# Patient Record
Sex: Female | Born: 1995 | Race: Black or African American | Hispanic: No | Marital: Single | Smoking: Never smoker
Health system: Southern US, Community
[De-identification: ages and names within clinical notes are randomized; demographics above are authoritative.]

## PROBLEM LIST (undated history)

## (undated) ENCOUNTER — Emergency Department (HOSPITAL_COMMUNITY)

## (undated) DIAGNOSIS — B3731 Acute candidiasis of vulva and vagina: Secondary | ICD-10-CM

## (undated) DIAGNOSIS — N390 Urinary tract infection, site not specified: Secondary | ICD-10-CM

## (undated) DIAGNOSIS — F419 Anxiety disorder, unspecified: Secondary | ICD-10-CM

## (undated) DIAGNOSIS — B373 Candidiasis of vulva and vagina: Secondary | ICD-10-CM

## (undated) DIAGNOSIS — A749 Chlamydial infection, unspecified: Secondary | ICD-10-CM

## (undated) HISTORY — PX: NO PAST SURGERIES: SHX2092

## (undated) HISTORY — DX: Anxiety disorder, unspecified: F41.9

---
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
## 2015-05-08 ENCOUNTER — Encounter (HOSPITAL_COMMUNITY): Payer: Self-pay | Admitting: *Deleted

## 2015-05-08 ENCOUNTER — Emergency Department (HOSPITAL_COMMUNITY): Payer: Medicaid Other

## 2015-05-08 ENCOUNTER — Emergency Department (HOSPITAL_COMMUNITY)
Admission: EM | Admit: 2015-05-08 | Discharge: 2015-05-08 | Disposition: A | Discharge status: Home / Self Care | Payer: Medicaid Other | Attending: Emergency Medicine | Admitting: Emergency Medicine

## 2015-05-08 DIAGNOSIS — Z79899 Other long term (current) drug therapy: Secondary | ICD-10-CM | POA: Diagnosis not present

## 2015-05-08 DIAGNOSIS — Z3202 Encounter for pregnancy test, result negative: Secondary | ICD-10-CM | POA: Diagnosis not present

## 2015-05-08 DIAGNOSIS — R0789 Other chest pain: Secondary | ICD-10-CM | POA: Diagnosis present

## 2015-05-08 DIAGNOSIS — J029 Acute pharyngitis, unspecified: Secondary | ICD-10-CM | POA: Insufficient documentation

## 2015-05-08 LAB — POC URINE PREG, ED: Preg Test, Ur: NEGATIVE

## 2015-05-08 MED ORDER — NAPROXEN 500 MG PO TABS: 500.0000 mg | ORAL_TABLET | Freq: Two times a day (BID) | ORAL | Status: DC

## 2015-05-08 NOTE — ED Notes (Signed)
Returned from xray

## 2015-05-08 NOTE — ED Provider Notes (Signed)
CSN: [id number]     Arrival date & time 05/08/15  [id number] History   First MD Initiated Contact with Patient 05/08/15 1007     Chief Complaint  Patient presents with  . Chest Pain     (Consider location/radiation/quality/duration/timing/severity/associated sxs/prior Treatment) The history is provided by the patient.   Laura Mendoza is a 19 y.o. female presenting with an 8 month plus history of left sided chest pain.  She describes constant soreness in the left upper chest wall into her lateral left breast which is worsened with palpation, lying on her stomach and with raising her left arm over her head. She denies injury, also denies shortness of breath, cough, weakness, fevers or chills. Her left breast is slightly larger than the right as well.  She denies nodules, mass, nipple discharge and is not pregnant.  Is sexually active, not currently on birth control, but took a home pregnancy test 2 weeks ago which was negative.  She was seen here last year for similar symptoms, has not followed up, no pcp.  She has had no treatments, has found no alleviators.    History reviewed. No pertinent past medical history. History reviewed. No pertinent past surgical history. History reviewed. No pertinent family history. Social History  Substance Use Topics  . Smoking status: Never Smoker   . Smokeless tobacco: None  . Alcohol Use: No   OB History    No data available     Review of Systems  Constitutional: Negative for fever and chills.  HENT: Positive for sore throat.   Eyes: Negative.   Respiratory: Negative for chest tightness and shortness of breath.   Cardiovascular: Positive for chest pain. Negative for palpitations and leg swelling.  Gastrointestinal: Negative for nausea, vomiting and abdominal pain.  Genitourinary: Negative.   Musculoskeletal: Negative for back pain and neck pain.  Skin: Negative.  Negative for rash and wound.  Neurological: Negative for dizziness,  weakness, light-headedness, numbness and headaches.  Psychiatric/Behavioral: Negative.       Allergies  Review of patient's allergies indicates no known allergies.  Home Medications   Prior to Admission medications   Medication Sig Start Date End Date Taking? Authorizing Provider  Multiple Vitamins-Minerals (MULTIVITAMIN GUMMIES WOMENS) CHEW Chew 2 tablets by mouth daily.   Yes Historical Provider, MD  ALPRAZolam (XANAX) 0.25 MG tablet Take 1 tablet (0.25 mg total) by mouth 2 (two) times daily as needed for anxiety. Patient not taking: Reported on 05/08/2015 04/21/14   Elson AreasLeslie K Sofia, PA-C  naproxen (NAPROSYN) 500 MG tablet Take 1 tablet (500 mg total) by mouth 2 (two) times daily. 05/08/15   Burgess AmorJulie Gorgeous [place], PA-C   BP 127/67 mmHg  Pulse 71  Temp(Src) 99.1 F (37.3 C) (Oral)  Resp 16  Ht 5\' 4"  (1.626 m)  Wt 100 lb (45.36 kg)  BMI 17.16 kg/m2  SpO2 99%  LMP 10/06/2014 Physical Exam  Constitutional: She appears well-developed and well-nourished.  HENT:  Head: Atraumatic.  Neck: Normal range of motion. Neck supple.  nontender  Cardiovascular: Regular rhythm.   Pulses:      Radial pulses are 2+ on the right side, and 2+ on the left side.  PulseBreast nontender, no masses, no dimpling. s equal bilaterally.    Pulmonary/Chest: Breath sounds normal.  Abdominal: Soft. Bowel sounds are normal. She exhibits no distension. There is no tenderness.  Musculoskeletal: She exhibits tenderness.  ttp left upper chest wall and into left axilla along edge of pectoralis.   Pain is increased  with resisted bicep flexion.  Neurological: She is alert. She has normal strength. She displays normal reflexes. No sensory deficit.  Skin: Skin is warm and dry.  Psychiatric: She has a normal mood and affect.    ED Course  Procedures (including critical care time) Labs Review Labs Reviewed  POC URINE PREG, ED    Imaging Review Dg Chest 2 View  05/08/2015  CLINICAL DATA:  Left upper chest pain for 8  months extending down the left arm. Occasional right arm pain. EXAM: CHEST  2 VIEW COMPARISON:  04/21/2014 FINDINGS: The heart size and mediastinal contours are within normal limits. Both lungs are clear. The visualized skeletal structures are unremarkable. IMPRESSION: No active cardiopulmonary disease. Electronically Signed   By: Gaylyn Rong M.D.   On: 05/08/2015 11:56   I have personally reviewed and evaluated these images and lab results as part of my medical decision-making.   EKG Interpretation None      MDM   Final diagnoses:  Chest wall pain    Patients  labs reviewed.  Radiological studies were viewed, interpreted and considered during the medical decision making and disposition process. I agree with radiologists reading.  Results were also discussed with patient.   Patient has reproducible chest wall pain suggesting musculoskeletal source.  She has no shortness of breath.  Breast exam is normal.  Advised heat therapy, anti-inflammatories.  She was given referrals to primary care for establishment of care and follow-up if symptoms persist or worsen.  The patient appears reasonably screened and/or stabilized for discharge and I doubt any other medical condition or other [facility] requiring further screening, evaluation, or treatment in the ED at this time prior to discharge.      Burgess Amor, PA-C 05/08/15 1215  Benjiman Core, MD 05/11/15 1434

## 2015-05-08 NOTE — ED Notes (Signed)
Patient transported to X-ray 

## 2015-05-08 NOTE — Discharge Instructions (Signed)
Chest Wall Pain Chest wall pain is pain in or around the bones and muscles of your chest. Sometimes, an injury causes this pain. Sometimes, the cause may not be known. This pain may take several weeks or longer to get better. HOME CARE INSTRUCTIONS  Pay attention to any changes in your symptoms. Take these actions to help with your pain:   Rest as told by your health care provider.   Avoid activities that cause pain. These include any activities that use your chest muscles or your abdominal and side muscles to lift heavy items.   If directed, apply ice to the painful area:  Put ice in a plastic bag.  Place a towel between your skin and the bag.  Leave the ice on for 20 minutes, 2-3 times per day.  Take over-the-counter and prescription medicines only as told by your health care provider.  Do not use tobacco products, including cigarettes, chewing tobacco, and e-cigarettes. If you need help quitting, ask your health care provider.  Keep all follow-up visits as told by your health care provider. This is important. SEEK MEDICAL CARE IF:  You have a fever.  Your chest pain becomes worse.  You have new symptoms. SEEK IMMEDIATE MEDICAL CARE IF:  You have nausea or vomiting.  You feel sweaty or light-headed.  You have a cough with phlegm (sputum) or you cough up blood.  You develop shortness of breath.   This information is not intended to replace advice given to you by your health care provider. Make sure you discuss any questions you have with your health care provider.   Document Released: 07/13/2005 Document Revised: 04/03/2015 Document Reviewed: 10/08/2014 Elsevier Interactive Patient Education 2016 [employer]    Use the medication prescribed.  In addition I recommend applying a heating pad twice daily tear chest wall, 20 minutes each treatment.  Your chest x-ray is negative today.  See the resource guide below for assistance locating a primary doctor.  I suspect  your symptoms are chest wall pain which means muscle soreness.  Please get rechecked for any persistent or worsening symptoms.     Emergency Department Resource Guide 1) Find a Doctor and Pay Out of Pocket Although you won't have to find out who is covered by your insurance plan, it is a good idea to ask around and get recommendations. You will then need to call the office and see if the doctor you have chosen will accept you as a new patient and what types of options they offer for patients who are self-pay. Some doctors offer discounts or will set up payment plans for their patients who do not have insurance, but you will need to ask so you aren't surprised when you get to your appointment.  2) Contact Your Local Health Department Not all health departments have doctors that can see patients for sick visits, but many do, so it is worth a call to see if yours does. If you don't know where your local health department is, you can check in your phone book. The CDC also has a tool to help you locate your state's health department, and many state websites also have listings of all of their local health departments.  3) Find a Walk-in Clinic If your illness is not likely to be very severe or complicated, you may want to try a walk in clinic. These are popping up all over the country in pharmacies, drugstores, and shopping centers. They're usually staffed by nurse practitioners or physician  assistants that have been trained to treat common illnesses and complaints. They're usually fairly quick and inexpensive. However, if you have serious medical issues or chronic medical problems, these are probably not your best option.  No Primary Care Doctor: - Call Health Connect at  [phone] - they can help you locate a primary care doctor that  accepts your insurance, provides certain services, etc. - Physician Referral Service- [phone]  Chronic Pain Problems: Organization         Address  Phone    Notes  [facility]  [phone] Patients need to be referred by their primary care doctor.   Medication Assistance: Organization         Address  Phone   Notes  [facility] Med[facility] [address] [place], [place] [address] [phone] --Must be a resident of [facility] -- Must have NO insurance coverage whatsoever (no Medicaid/ Medicare, etc.) -- The pt. MUST have a primary care doctor that directs their care regularly and follows them in the community   MedAssist  [phone]   [employer]  [phone]    Agencies that provide inexpensive medical care: Organization         Address  Phone   Notes  [place]  [phone]   [facility]    [phone]   [facility] [place], [place] [address] [phone]   [place] [address] [place] [phone]   Planned Parenthood    [phone]   [facility]    [phone]   [place] [address], [place] Phone:  [phone], Fax:  [phone] Hours of Operation:  9 am - 6 pm, M-F.  Also accepts Medicaid/Medicare and self-pay.  [facility] for [address], [address], [place] Phone: [phone], Fax: [phone]. Hours of Operation:  8:30 am - 5:30 pm, M-F.  Also accepts Medicaid and self-pay.  [facility] [place] [address] [place] Phone: [phone]   [employer], [place], [place] [phone], Ext. [phone] Mondays & Thursdays: 7-9 AM.  First 15 patients are seen on a first come, first serve basis.    [place] Providers:  Organization         Address  Phone   Notes  [facility] [address],  [phone] Also accepts self-pay patients.  [facility]  [id number] [address], [place]  [phone]   [place], [address], [place] [phone]   [facility] [address], [place] [phone]   Lucianne Lei [address], [place]   [phone] Only accepts [place] patients after they have their name applied to their card.   Self-Pay (no insurance) in [facility]:  Organization         Address  Phone   Notes  Sickle Cell Patients, [facility] [place] [phone]   [facility] [place] [phone]   [facility] [place]  [place], [address], [place] [phone]   [facility]/Dr. Vista Lawman  [address] [place]  [address], [place] or [address], [place] [phone] Phone number for both [place] and [place] locations is the same.  [facility]gent [facility] [address] [place] [phone]   [facility] [address], [place] or [address] [phone]   [facility] [address] [place] [phone], phone; [phone], fax Sees patients 1st and 3rd Saturday of every month.  Must not qualify for public or private insurance (i.e. Medicaid, Medicare, [place] Health Choice, Veterans' Benefits)  Household income should be no more than 200% of the poverty level The clinic cannot treat you if you are pregnant or think you are pregnant  Sexually transmitted diseases are not treated at the clinic.    Dental Care: Organization         Address  Phone  Notes  [facility] Department of [facility] [address] [place], [place] [phone] Accepts children up to age 26 who are enrolled in [place] or [place] Health Choice; pregnant women with a Medicaid card; and children who have  applied for Medicaid or [place] Health Choice, but were declined, whose parents can pay a reduced fee at time of service.  [facility] Department of [facility]  [address], [place] [phone] Accepts children up to age 55 who are enrolled in [place] or [place] Health Choice; pregnant women with a Medicaid card; and children who have applied for Medicaid or [place] Health Choice, but were declined, whose parents can pay a reduced fee at time of service.  [facility]  [address] [place], [place] [phone] Patients are seen by appointment only. Walk-ins are not accepted. [facility] will see patients 22 years of age and older. Monday - Tuesday (8am-5pm) Most Wednesdays (8:30-5pm) $30 per visit, cash only  [facility] Adult [facility] Access PRO[facility]  [address], [facility] [phone] Patients are seen by appointment only. Walk-ins are not accepted. [facility] will see patients 61 years of age and older. One Wednesday Evening (Monthly: Volunteer Based).  $30 per visit, cash only  [employer]  [phone] for adults; Children under age 3, call Graduate Pediatric Dentistry at [phone]. Children aged 68-14, please call [phone] to request a pediatric application.  Dental services are provided in all areas of dental care including fillings, crowns and bridges, complete and partial dentures, implants, gum treatment, root canals, and extractions. Preventive care is also provided. Treatment is provided to both adults and children. Patients are selected via a lottery and there is often a waiting list.   [facility] [address] [place]  [phone] [email or web address]   [facility] [address] [place], [place] [phone], Ext. [phone] Second and Fourth Thursday of each month, opens at 6:30 AM; Clinic ends at 9 AM.  Patients are seen on a first-come first-served basis, and a  limited number are seen during each clinic.   [facility]  [address] Ether [place], [place] [phone]   Eligibility Requirements You must have lived in [place] [employer], [place], or [place] counties for at least the last three months.   You cannot be eligible for state or federal sponsored [place], including [employer], [place], or [employer].   You generally cannot be eligible for healthcare insurance through your employer.    How to  apply: Eligibility screenings are held every Tuesday and Wednesday afternoon from 1:00 pm until 4:00 pm. You do not need an appointment for the interview!  [facility] [address] [place], [place] [phone]   [facility] Health Department  [phone]   [facility] Health Department  [phone]   [facility] Health Department  [phone]    Behavioral Health Resources in the Community: Intensive Outpatient Programs Organization         Address  Phone  Notes  [facility] [address] [place], [place] [phone]   [facility] [address], [place], [place] [phone]   [facility] [address] [place], [place]  [phone]   [facility] [address],  [place], [place] [phone] or [phone]   Substance Abuse Resources Organization         Address  Phone  Notes  [facility] Drug Services  [phone]   [facility]  [phone]   [facility] [place]  [phone]   Floydene Flock  [phone]   Residential & Outpatient Substance Abuse Program  [phone]   Psychological Services Organization         Address  Phone  Notes  [facility]  [phone]   [facility] Services  [phone]   [facility] [address], [place] [phone] or [phone]    Mobile Crisis Teams Organization          Address  Phone  Notes  [facility], Mobile Crisis Care Unit  [phone]   [facility]  [address] [place], [place] [phone]   Doristine Locks [address] [place] [phone]    Self-Help/Support Groups Organization         Address  Phone             Notes  [facility] [place] - variety of support groups  336- I7437963 Call for more information  Narcotics Anonymous (NA), [facility] [address], [employer] [place]  2 meetings at this location   Statistician         Address  Phone  Notes  [facility] [address],    [place]  [phone]   [facility]  [address] [place] [address], [place], [place] [phone]   [facility] [address] [place], [place] [phone] Admissions: 8am-3pm M-F  [facility] [address]    [place], [place] [phone]   [facility] [address] [place], [place], [place] [phone]   [facility] [address],  [place], [place] [phone]   [facility] - Intensive Outpatient [address], [place], [place] [phone]   [facility] [address] [place]  [place], [place] [phone] or [phone]   [facility] ([facility]) [address] [place], [place] [phone] Accepts Medicaid  [facility] [address],  [place] [phone] Substance Abuse/Addiction Treatment   [facility] Organization         Address  Phone  Notes  [facility]  [phone]   Angie Fava, PhD [address]vin Knack [place], [place]   [phone] or [phone]   [facility] Behavioral   [address] [place], [place] [phone]   [facility] [address] [place], [place] (  336) F9272065 Insurance/Medicaid/sponsorship  through [facility] and Families [address]                                    [place], [place] [phone] Therapy/tele-psych/case  [facility] [address]   [place], [place] [phone]    Dr. Lolly Mustache  [phone]   [facility] [place]  [employer] [facility] Dept. 1) [address], [place] 2) [address] [place] 3)  [address], [place] [phone]  [phone]   [facility] Child Abuse Hotline 787-377-1682 or [phone] (After Hours)

## 2015-05-08 NOTE — ED Notes (Signed)
C/o pain in left chest with radiation into left arm. Onset 8 months ago . C/o pai in her left breast states she feels her left breast looks different than right.,

## 2015-12-11 ENCOUNTER — Encounter (HOSPITAL_COMMUNITY): Payer: Self-pay

## 2015-12-11 ENCOUNTER — Emergency Department (HOSPITAL_COMMUNITY)
Admission: EM | Admit: 2015-12-11 | Discharge: 2015-12-11 | Disposition: A | Discharge status: Home / Self Care | Payer: Medicaid Other | Attending: Emergency Medicine | Admitting: Emergency Medicine

## 2015-12-11 DIAGNOSIS — Z791 Long term (current) use of non-steroidal anti-inflammatories (NSAID): Secondary | ICD-10-CM | POA: Insufficient documentation

## 2015-12-11 DIAGNOSIS — Z79899 Other long term (current) drug therapy: Secondary | ICD-10-CM | POA: Insufficient documentation

## 2015-12-11 DIAGNOSIS — M79604 Pain in right leg: Secondary | ICD-10-CM | POA: Insufficient documentation

## 2015-12-11 MED ORDER — NAPROXEN 500 MG PO TABS: 500.0000 mg | ORAL_TABLET | Freq: Two times a day (BID) | ORAL | Status: DC

## 2015-12-11 MED ORDER — DIAZEPAM 5 MG PO TABS: 5.0000 mg | ORAL_TABLET | Freq: Two times a day (BID) | ORAL | Status: DC

## 2015-12-11 NOTE — ED Notes (Signed)
Pt stable, ambulatory, states understanding of discharge instructions 

## 2015-12-11 NOTE — ED Provider Notes (Signed)
CSN: [id number]     Arrival date & time 12/11/15  1850 History  By signing my name below, I, Evon Slack, attest that this documentation has been prepared under the direction and in the presence of Melton Krebs, PA-C. Electronically Signed: Evon Slack, ED Scribe. 12/11/2015. 7:02 PM.    Chief Complaint  Patient presents with  . Leg Pain   Patient is a 20 y.o. female presenting with leg pain. The history is provided by the patient. No language interpreter was used.  Leg Pain  HPI Comments: Laura Mendoza is a 20 y.o. female who presents to the Emergency Department complaining of intermittent stinging right leg pain onset 6 months prior. Pt reports that the pain has recently worsened over the last 2 weeks. Pt states that the pain intermittently radiates from her toes to her upper leg. Pt also reports pain intermittently goes into the left leg as well. She states that the pain is worse when sitting or lying down. She states that the pain is intermittently better when standing. Pt states she has tried Advil with no relief.  Denies leg swelling or color change to the leg. Pt denies recent surgeries. Pt denies injury or trauma to the leg.    No past medical history on file. No past surgical history on file. No family history on file. Social History  Substance Use Topics  . Smoking status: Never Smoker   . Smokeless tobacco: Not on file  . Alcohol Use: No   OB History    No data available     Review of Systems  Musculoskeletal: Positive for arthralgias.   A complete 10 system review of systems was obtained and all systems are negative except as noted in the HPI and PMH.     Allergies  Review of patient's allergies indicates no known allergies.  Home Medications   Prior to Admission medications   Medication Sig Start Date End Date Taking? Authorizing Provider  ALPRAZolam (XANAX) 0.25 MG tablet Take 1 tablet (0.25 mg total) by mouth 2 (two) times daily  as needed for anxiety. Patient not taking: Reported on 05/08/2015 04/21/14   Elson Areas, PA-C  Multiple Vitamins-Minerals (MULTIVITAMIN GUMMIES WOMENS) Marquita Palms 2 tablets by mouth daily.    Historical Provider, MD  naproxen (NAPROSYN) 500 MG tablet Take 1 tablet (500 mg total) by mouth 2 (two) times daily. 05/08/15   Burgess Amor, PA-C   BP 112/79 mmHg  Pulse 80  Temp(Src) 99.4 F (37.4 C) (Oral)  Resp 24  SpO2 99%   Physical Exam  Constitutional: She is oriented to person, place, and time. She appears well-developed and well-nourished. No distress.  HENT:  Head: Normocephalic and atraumatic.  Eyes: Conjunctivae and EOM are normal.  Neck: Neck supple. No tracheal deviation present.  Cardiovascular: Normal rate.   Pulmonary/Chest: Effort normal. No respiratory distress.  Musculoskeletal: Normal range of motion.  No LE edema, discolorations or tenderness  Neurological: She is alert and oriented to person, place, and time.  Skin: Skin is warm and dry.  Psychiatric: She has a normal mood and affect. Her behavior is normal.  Nursing note and vitals reviewed.   ED Course  Procedures  DIAGNOSTIC STUDIES: Oxygen Saturation is 99% on RA, normal by my interpretation.    COORDINATION OF CARE: 7:00 PM-Discussed treatment plan with pt at bedside and pt agreed to plan.    MDM   Final diagnoses:  Right leg pain   Patient nontoxic appearing, VSS. Presentation not  consistent with DVT, septic joint, trauma. Imaging not indicated at this time. Given pain is worse in foot/ankle, will ace wrap and discharge with symptomatic therapy. Patient may be safely discharged home. Discussed reasons for return. Patient to follow-up with primary care provider within one week. Patient in understanding and agreement with the plan.   I personally performed the services described in this documentation, which was scribed in my presence. The recorded information has been reviewed and is  accurate.    Melton KrebsSamantha Nicole Akeila Lana, PA-C 12/18/15 1228  Raeford RazorStephen Kohut, MD 12/19/15 [phone]

## 2015-12-11 NOTE — ED Notes (Signed)
C/o stinging pain to right leg for the last 6 months but has been getting worse

## 2015-12-11 NOTE — Discharge Instructions (Signed)
[name],  Nice meeting you! Please follow-up with your primary care provider. Return to the emergency department if you develop increased pain, fevers, chills, shortness of breath, chest pain, new/worsening symptoms. Feel better soon!  S. Lane HackerNicole Shantale Holtmeyer, PA-C   Musculoskeletal Pain Musculoskeletal pain is muscle and boney aches and pains. These pains can occur in any part of the body. Your caregiver may treat you without knowing the cause of the pain. They may treat you if blood or urine tests, X-rays, and other tests were normal.  CAUSES There is often not a definite cause or reason for these pains. These pains may be caused by a type of germ (virus). The discomfort may also come from overuse. Overuse includes working out too hard when your body is not fit. Boney aches also come from weather changes. Bone is sensitive to atmospheric pressure changes. HOME CARE INSTRUCTIONS   Ask when your test results will be ready. Make sure you get your test results.  Only take over-the-counter or prescription medicines for pain, discomfort, or fever as directed by your caregiver. If you were given medications for your condition, do not drive, operate machinery or power tools, or sign legal documents for 24 hours. Do not drink alcohol. Do not take sleeping pills or other medications that may interfere with treatment.  Continue all activities unless the activities cause more pain. When the pain lessens, slowly resume normal activities. Gradually increase the intensity and duration of the activities or exercise.  During periods of severe pain, bed rest may be helpful. Lay or sit in any position that is comfortable.  Putting ice on the injured area.  Put ice in a bag.  Place a towel between your skin and the bag.  Leave the ice on for 15 to 20 minutes, 3 to 4 times a day.  Follow up with your caregiver for continued problems and no reason can be found for the pain. If the pain  becomes worse or does not go away, it may be necessary to repeat tests or do additional testing. Your caregiver may need to look further for a possible cause. SEEK IMMEDIATE MEDICAL CARE IF:  You have pain that is getting worse and is not relieved by medications.  You develop chest pain that is associated with shortness or breath, sweating, feeling sick to your stomach (nauseous), or throw up (vomit).  Your pain becomes localized to the abdomen.  You develop any new symptoms that seem different or that concern you. MAKE SURE YOU:   Understand these instructions.  Will watch your condition.  Will get help right away if you are not doing well or get worse.   This information is not intended to replace advice given to you by your health care provider. Make sure you discuss any questions you have with your health care provider.   Document Released: 07/13/2005 Document Revised: 10/05/2011 Document Reviewed: 03/17/2013 Elsevier Interactive Patient Education [employer]2016 Elsevier Inc.

## 2016-05-14 ENCOUNTER — Encounter (INDEPENDENT_AMBULATORY_CARE_PROVIDER_SITE_OTHER): Payer: Self-pay

## 2016-05-14 ENCOUNTER — Ambulatory Visit (INDEPENDENT_AMBULATORY_CARE_PROVIDER_SITE_OTHER): Payer: Managed Care, Other (non HMO) | Admitting: Internal Medicine

## 2016-05-14 ENCOUNTER — Encounter: Payer: Self-pay | Admitting: Internal Medicine

## 2016-05-14 VITALS — BP 100/74 | HR 80 | Ht 64.5 in | Wt 103.4 lb

## 2016-05-14 DIAGNOSIS — R110 Nausea: Secondary | ICD-10-CM | POA: Diagnosis not present

## 2016-05-14 MED ORDER — ONDANSETRON HCL 4 MG PO TABS: 4.0000 mg | ORAL_TABLET | Freq: Four times a day (QID) | ORAL | 2 refills | Status: DC | PRN

## 2016-05-14 NOTE — Progress Notes (Signed)
HISTORY OF PRESENT ILLNESS:  Laura Mendoza is a 20 y.o. female with a history of anxiety who is referred today by her primary care provider Dr. Maisie Fushomas regarding chronic nausea. Patient reports developing problems with nausea proximal 6 months ago. The symptom is daily. Most prominent in the afternoon and evening. Her only medication is birth control which she started well after developing nausea. She denies heartburn or indigestion. She denies abdominal pain but has a history of diffuse fasciculations including the abdominal wall. No vomiting or weight loss. No new onset headaches, dizziness, or neurologic complaints. Denies cannabis use. Does have intermittent problems with stress and anxiety. Has not had workup or treatment. Last menstrual period approximately 3 weeks ago.  REVIEW OF SYSTEMS:  All non-GI ROS negative except for anxiety, confusion, fatigue, sleeping problems, headaches  Past Medical History:  Diagnosis Date  . Anxiety     Past Surgical History:  Procedure Laterality Date  . NO PAST SURGERIES      Social History Laura Mendoza  reports that she has never smoked. She has never used smokeless tobacco. She reports that she does not drink alcohol or use drugs.  family history includes Diabetes in her maternal grandmother; Hypertension in her maternal grandmother and mother; Kidney Stones in her father; Kidney disease in her maternal grandmother.  No Known Allergies     PHYSICAL EXAMINATION: Vital signs: BP 100/74 (BP Location: Left Arm, Patient Position: Sitting, Cuff Size: Normal)   Pulse 80   Ht 5' 4.5" (1.638 m) Comment: height measured without shoes  Wt 103 lb 6 oz (46.9 kg)   LMP 04/18/2016   BMI 17.47 kg/m   Constitutional: generally well-appearing, no acute distress Psychiatric: alert and oriented x3, cooperative Eyes: extraocular movements intact, anicteric, conjunctiva pink Mouth: oral pharynx moist, no lesions Neck: supple no  lymphadenopathy Cardiovascular: heart regular rate and rhythm, no murmur Lungs: clear to auscultation bilaterally Abdomen: soft, nontender, nondistended, no obvious ascites, no peritoneal signs, normal bowel sounds, no organomegaly Rectal:Omitted Extremities: no clubbing, cyanosis, or lower extremity edema bilaterally Skin: no lesions on visible extremities Neuro: No focal deficits. Normal DTRs  ASSESSMENT:  #1. Chronic nausea without alarm features. Rule out GI causes. Rule out non-GI causes  PLAN:  #1. Prescribe Zofran 4 mg, 1-2 every 6 hours as needed for nausea. #2. Schedule upper endoscopy to rule out GI mucosal causes for nausea.The nature of the procedure, as well as the risks, benefits, and alternatives were carefully and thoroughly reviewed with the patient. Ample time for discussion and questions allowed. The patient understood, was satisfied, and agreed to proceed. #3. If endoscopy negative, gastric empty scan to rule out gastroparesis #4. If all the above negative and symptoms persist, return to PCP to evaluate for non-GI causes of nausea.  A copy of this consultation note has been sent to PCP

## 2016-05-14 NOTE — Patient Instructions (Addendum)
You have been scheduled for an endoscopy. Please follow written instructions given to you at your visit today. If you use inhalers (even only as needed), please bring them with you on the day of your procedure.  We have sent the following medications to your pharmacy for you to pick up at your convenience:  Zofran

## 2016-05-18 ENCOUNTER — Encounter: Payer: Self-pay | Admitting: Internal Medicine

## 2016-05-18 ENCOUNTER — Ambulatory Visit (AMBULATORY_SURGERY_CENTER): Payer: Managed Care, Other (non HMO) | Admitting: Internal Medicine

## 2016-05-18 VITALS — BP 110/60 | HR 82 | Temp 98.6°F | Resp 16 | Ht 64.0 in | Wt 103.0 lb

## 2016-05-18 DIAGNOSIS — R110 Nausea: Secondary | ICD-10-CM | POA: Diagnosis not present

## 2016-05-18 DIAGNOSIS — K2100 Gastro-esophageal reflux disease with esophagitis, without bleeding: Secondary | ICD-10-CM

## 2016-05-18 MED ORDER — SODIUM CHLORIDE 0.9 % IV SOLN
500.0000 mL | INTRAVENOUS | Status: DC
Start: 2016-05-18 — End: 2017-05-11

## 2016-05-18 MED ORDER — OMEPRAZOLE 20 MG PO CPDR: 20.0000 mg | DELAYED_RELEASE_CAPSULE | Freq: Every day | ORAL | 3 refills | Status: DC

## 2016-05-18 NOTE — Op Note (Signed)
[facility] Patient Name: Laura Mendoza Procedure Date: 05/18/2016 2:42 PM MRN: [id number] Endoscopist: Wilhemina Bonito. Marina Goodell , MD Age: 20 Referring MD:  Date of Birth: [birth year] Gender: Female Account #: [id number] Procedure:                Upper GI endoscopy Indications:              Nausea Medicines:                Monitored Anesthesia Care Procedure:                Pre-Anesthesia Assessment:                           - Prior to the procedure, a History and Physical                            was performed, and patient medications and                            allergies were reviewed. The patient's tolerance of                            previous anesthesia was also reviewed. The risks                            and benefits of the procedure and the sedation                            options and risks were discussed with the patient.                            All questions were answered, and informed consent                            was obtained. Prior Anticoagulants: The patient has                            taken no previous anticoagulant or antiplatelet                            agents. ASA Grade Assessment: II - A patient with                            mild systemic disease. After reviewing the risks                            and benefits, the patient was deemed in                            satisfactory condition to undergo the procedure.                           After obtaining informed consent, the endoscope was  passed under direct vision. Throughout the                            procedure, the patient's blood pressure, pulse, and                            oxygen saturations were monitored continuously. The                            Model GIF-[id number](SN#[id number]) scope was introduced                            through the mouth, and advanced to the second part                            of duodenum. The upper GI endoscopy  was                            accomplished without difficulty. The patient                            tolerated the procedure well. Scope In: Scope Out: Findings:                 The esophagus was normal, except for mild                            esophagitis at the Z line manifested by erythema                            and edema.                           The stomach was normal.                           The examined duodenum was normal.                           The cardia and gastric fundus were normal on                            retroflexion. Complications:            No immediate complications. Estimated Blood Loss:     Estimated blood loss: none. Impression:               - Mild reflux esophagitis. Otherwise normal EGD. Recommendation:           - Prescribe omeprazole 20 mg daily; #30; 3 refills.                           - Resume previous diet.                           - Continue present medications.                           -  Consider gastric emptying scan if symptoms do not                            improve on omeprazole John N. Marina Goodell, MD 05/18/2016 2:57:03 PM This report has been signed electronically.

## 2016-05-18 NOTE — Progress Notes (Signed)
Report to PACU, RN, vss, BBS= Clear.  

## 2016-05-18 NOTE — Patient Instructions (Signed)
YOU HAD AN ENDOSCOPIC PROCEDURE TODAY AT THE [facility]:   Refer to the procedure report that was given to you for any specific questions about what was found during the examination.  If the procedure report does not answer your questions, please call your gastroenterologist to clarify.  If you requested that your care partner not be given the details of your procedure findings, then the procedure report has been included in a sealed envelope for you to review at your convenience later.  YOU SHOULD EXPECT: Some feelings of bloating in the abdomen. Passage of more gas than usual.  Walking can help get rid of the air that was put into your GI tract during the procedure and reduce the bloating. If you had a lower endoscopy (such as a colonoscopy or flexible sigmoidoscopy) you may notice spotting of blood in your stool or on the toilet paper. If you underwent a bowel prep for your procedure, you may not have a normal bowel movement for a few days.  Please Note:  You might notice some irritation and congestion in your nose or some drainage.  This is from the oxygen used during your procedure.  There is no need for concern and it should clear up in a day or so.  SYMPTOMS TO REPORT IMMEDIATELY:    Following upper endoscopy (EGD)  Vomiting of blood or coffee ground material  New chest pain or pain under the shoulder blades  Painful or persistently difficult swallowing  New shortness of breath  Fever of 100F or higher  Black, tarry-looking stools  For urgent or emergent issues, a gastroenterologist can be reached at any hour by calling [phone].   DIET:  We do recommend a small meal at first, but then you may proceed to your regular diet.  Drink plenty of fluids but you should avoid alcoholic beverages for 24 hours.  ACTIVITY:  You should plan to take it easy for the rest of today and you should NOT DRIVE or use heavy machinery until tomorrow (because of the sedation medicines used  during the test).    FOLLOW UP: Our staff will call the number listed on your records the next business day following your procedure to check on you and address any questions or concerns that you may have regarding the information given to you following your procedure. If we do not reach you, we will leave a message.  However, if you are feeling well and you are not experiencing any problems, there is no need to return our call.  We will assume that you have returned to your regular daily activities without incident.  If any biopsies were taken you will be contacted by phone or by letter within the next 1-3 weeks.  Please call us at [phone] if you have not heard about the biopsies in 3 weeks.    SIGNATURES/CONFIDENTIALITY: You and/or your care partner have signed paperwork which will be entered into your electronic medical record.  These signatures attest to the fact that that the information above on your After Visit Summary has been reviewed and is understood.  Full responsibility of the confidentiality of this discharge information lies with you and/or your care-partner.   Resume medications. Information given on esophagitis. 

## 2016-05-19 ENCOUNTER — Telehealth: Payer: Self-pay | Admitting: Internal Medicine

## 2016-05-19 ENCOUNTER — Telehealth: Payer: Self-pay

## 2016-05-19 ENCOUNTER — Telehealth: Payer: Self-pay | Admitting: *Deleted

## 2016-05-19 NOTE — Telephone Encounter (Signed)
  Follow up Call-  Call back number 05/18/2016  Post procedure Call Back phone  # [phone]  Permission to leave phone message No  Some recent data might be hidden    Patient was called for follow up after her procedure on 05/18/2016. No answer at the number given for follow up phone call. No message was left on the answering machine due to patients request. This was the second attempt to contact the patient.

## 2016-05-19 NOTE — Telephone Encounter (Signed)
Pt was prescribed omeprazole for esophagitis.

## 2016-05-19 NOTE — Telephone Encounter (Signed)
Discussed meds with pt and results per procedure report.

## 2016-05-19 NOTE — Telephone Encounter (Signed)
  Follow up Call-  Call back number 05/18/2016  Post procedure Call Back phone  # [phone]  Permission to leave phone message No  Some recent data might be hidden     Patient questions:  Message left to call us if necessary.

## 2016-08-09 ENCOUNTER — Encounter (HOSPITAL_COMMUNITY): Payer: Self-pay | Admitting: Emergency Medicine

## 2016-08-09 ENCOUNTER — Emergency Department (HOSPITAL_COMMUNITY)
Admission: EM | Admit: 2016-08-09 | Discharge: 2016-08-09 | Disposition: A | Discharge status: Home / Self Care | Payer: Managed Care, Other (non HMO) | Attending: Emergency Medicine | Admitting: Emergency Medicine

## 2016-08-09 DIAGNOSIS — Z79899 Other long term (current) drug therapy: Secondary | ICD-10-CM | POA: Diagnosis not present

## 2016-08-09 DIAGNOSIS — M62838 Other muscle spasm: Secondary | ICD-10-CM | POA: Insufficient documentation

## 2016-08-09 MED ORDER — DIAZEPAM 5 MG PO TABS: 5.0000 mg | ORAL_TABLET | Freq: Every evening | ORAL | 0 refills | Status: DC

## 2016-08-09 NOTE — ED Notes (Signed)
Papers and prescriptions reviewed with patient and she verbalizes understanding and intent to follow up

## 2016-08-09 NOTE — ED Triage Notes (Signed)
Pt c/o generalized muscle spasms x 2 years. Pt has seen PMD for same in past was told the cause was her eating habits.

## 2016-08-09 NOTE — ED Provider Notes (Signed)
[facility]-EMERGENCY DEPT Provider Note   CSN: [id number] Arrival date & time: 08/09/16  1559  By signing my name below, I, Modena JanskyAlbert Thayil, attest that this documentation has been prepared under the direction and in the presence of Nelva Nayobert Lakeisha Waldrop, MD . Electronically Signed: Modena JanskyAlbert Thayil, Scribe. 08/09/2016. 6:26 PM.  History   Chief Complaint Chief Complaint  Patient presents with  . Spasms  . Facial Pain   The history is provided by the patient. No language interpreter was used.   HPI Comments: Laura Mendoza is a 21 y.o. female who presents to the Emergency Department complaining of intermittent moderate generalized muscle spasms that started about 2 years ago. She states she had this problem intermittently, but recently it was worsened. She took diazepam with temporary relief. No exacerbating factors. She has associated generalized myalgias with spasm episodes. She denies any other complaints.     PCP: Sharmon Revere'KELLEY,BRIAN S, MD  Past Medical History:  Diagnosis Date  . Anxiety     There are no active problems to display for this patient.   Past Surgical History:  Procedure Laterality Date  . NO PAST SURGERIES      OB History    No data available       Home Medications    Prior to Admission medications   Medication Sig Start Date End Date Taking? Authorizing Provider  diazepam (VALIUM) 5 MG tablet Take 1 tablet (5 mg total) by mouth Nightly. 08/09/16   Nelva Nayobert Bryn Perkin, MD  naproxen (NAPROSYN) 500 MG tablet Take 1 tablet (500 mg total) by mouth 2 (two) times daily. Patient not taking: Reported on 05/18/2016 12/11/15   Melton KrebsSamantha Nicole Riley, PA-C  omeprazole (PRILOSEC) 20 MG capsule Take 1 capsule (20 mg total) by mouth daily. 05/18/16 07/18/17  Hilarie FredricksonJohn N Perry, MD  ondansetron (ZOFRAN) 4 MG tablet Take 1-2 tablets (4-8 mg total) by mouth every 6 (six) hours as needed for nausea or vomiting. Patient not taking: Reported on 05/18/2016 05/14/16   Hilarie FredricksonJohn N Perry, MD     Family History Family History  Problem Relation Age of Onset  . Hypertension Mother   . Kidney Stones Father   . Hypertension Maternal Grandmother   . Kidney disease Maternal Grandmother   . Diabetes Maternal Grandmother   . Colon cancer Neg Hx   . Colon polyps Neg Hx   . Esophageal cancer Neg Hx   . Rectal cancer Neg Hx   . Stomach cancer Neg Hx     Social History Social History  Substance Use Topics  . Smoking status: Never Smoker  . Smokeless tobacco: Never Used  . Alcohol use No     Allergies   Patient has no known allergies.   Review of Systems Review of Systems  Musculoskeletal: Positive for myalgias (Generalized).       +muscle spasms (generalized)  All other systems reviewed and are negative.    Physical Exam Updated Vital Signs BP 135/86 (BP Location: Left Arm)   Pulse 78   Temp 98.7 F (37.1 C) (Oral)   Resp 20   Ht 5\' 4"  (1.626 m)   Wt 104 lb (47.2 kg)   LMP 06/29/2016   SpO2 100%   BMI 17.85 kg/m   Physical Exam  Constitutional: She is oriented to person, place, and time. She appears well-developed and well-nourished. No distress.  HENT:  Head: Normocephalic and atraumatic.  Eyes: Pupils are equal, round, and reactive to light.  Neck: Normal range of motion.  Cardiovascular: Normal rate  and intact distal pulses.   Pulmonary/Chest: No respiratory distress.  Abdominal: Normal appearance. She exhibits no distension.  Musculoskeletal: Normal range of motion.  Neurological: She is alert and oriented to person, place, and time. She has normal strength and normal reflexes. No cranial nerve deficit. She displays a negative Romberg sign. GCS eye subscore is 4. GCS verbal subscore is 5. GCS motor subscore is 6.  Skin: Skin is warm and dry. No rash noted.  Psychiatric: She has a normal mood and affect. Her behavior is normal.  Nursing note and vitals reviewed.    ED Treatments / Results  DIAGNOSTIC STUDIES: Oxygen Saturation is 100% on RA,  normal by my interpretation.    COORDINATION OF CARE: 6:30 PM- Pt advised of plan for treatment and pt agrees.  Labs (all labs ordered are listed, but only abnormal results are displayed) Labs Reviewed - No data to display  EKG  EKG Interpretation None       Radiology No results found.  Procedures Procedures (including critical care time)  Medications Ordered in ED Medications - No data to display   Initial Impression / Assessment and Plan / ED Course  I have reviewed the triage vital signs and the nursing notes.  Pertinent labs & imaging results that were available during my care of the patient were reviewed by me and considered in my medical decision making (see chart for details).  Clinical Course       Final Clinical Impressions(s) / ED Diagnoses   Final diagnoses:  Muscle spasm   New Prescriptions Current Discharge Medication List    I personally performed the services described in this documentation, which was scribed in my presence. The recorded information has been reviewed and considered.   Nelva Nay, MD 08/09/16 [phone]

## 2016-09-05 ENCOUNTER — Ambulatory Visit (INDEPENDENT_AMBULATORY_CARE_PROVIDER_SITE_OTHER): Payer: Managed Care, Other (non HMO) | Admitting: Family Medicine

## 2016-09-05 VITALS — BP 98/64 | HR 69 | Temp 98.4°F | Resp 16 | Ht 65.5 in | Wt 106.0 lb

## 2016-09-05 DIAGNOSIS — Z23 Encounter for immunization: Secondary | ICD-10-CM | POA: Diagnosis not present

## 2016-09-05 DIAGNOSIS — Z111 Encounter for screening for respiratory tuberculosis: Secondary | ICD-10-CM | POA: Diagnosis not present

## 2016-09-05 NOTE — Progress Notes (Signed)
  Tuberculosis Risk Questionnaire  1. No Were you born outside the [place] in one of the following parts of the world: [place], [place], [place], [place] America or Eastern [place]?    2. No Have you traveled outside the [place] and lived for more than one month in one of the following parts of the world: [place], [place], [place], [place] or [place]?    3. No Do you have a compromised immune system such as from any of the following conditions:HIV/AIDS, organ or bone marrow transplantation, diabetes, immunosuppressive medicines (e.g. Prednisone, Remicaide), leukemia, lymphoma, cancer of the head or neck, gastrectomy or jejunal bypass, end-stage renal disease (on dialysis), or silicosis?     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    5. No Have you ever: injected illegal drugs, used crack cocaine, lived in a homeless shelter  or been in jail or prison?     6. No Have you ever been exposed to anyone with infectious tuberculosis?    Tuberculosis Symptom Questionnaire  Do you currently have any of the following symptoms?  1. No Unexplained cough lasting more than 3 weeks?   2. No Unexplained fever lasting more than 3 weeks.   3. No Night Sweats (sweating that leaves the bedclothes and sheets wet)     4. No Shortness of Breath   5. No Chest Pain   6. No Unintentional weight loss    7. No Unexplained fatigue (very tired for no reason)    

## 2016-09-05 NOTE — Patient Instructions (Signed)
     IF you received an x-ray today, you will receive an invoice from [facility]. Please contact [facility] at [phone] with questions or concerns regarding your invoice.   IF you received labwork today, you will receive an invoice from [facility]. Please contact [facility] at [phone] with questions or concerns regarding your invoice.   Our billing staff will not be able to assist you with questions regarding bills from these companies.  You will be contacted with the lab results as soon as they are available. The fastest way to get your results is to activate your My Chart account. Instructions are located on the last page of this paperwork. If you have not heard from us regarding the results in 2 weeks, please contact this office.     

## 2016-09-05 NOTE — Progress Notes (Signed)
Subjective:  By signing my name below, I, Essence Howell, attest that this documentation has been prepared under the direction and in the presence of Norberto Sorenson, MD Electronically Signed: Charline Bills, ED Scribe 09/05/2016 at 9:47 AM.   Patient ID: Laura Mendoza, female    DOB: 17-Feb-1996, 20 y.o.   MRN: [id number]  Chief Complaint  Patient presents with  . Flu Vaccine  . PPD Placement   HPI HPI Comments: Laura Mendoza is a 21 y.o. female who presents to the [facility] for a flu vaccine and ppd placement. TB risk questionnaire noted in previous note. 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 She has had several TB tests in the past without complications.   Past Medical History:  Diagnosis Date  . Anxiety    Current Outpatient Prescriptions on File Prior to Visit  Medication Sig Dispense Refill  . diazepam (VALIUM) 5 MG tablet Take 1 tablet (5 mg total) by mouth Nightly. 15 tablet 0  . naproxen (NAPROSYN) 500 MG tablet Take 1 tablet (500 mg total) by mouth 2 (two) times daily. (Patient not taking: Reported on 05/18/2016) 30 tablet 0  . omeprazole (PRILOSEC) 20 MG capsule Take 1 capsule (20 mg total) by mouth daily. (Patient not taking: Reported on 09/05/2016) 30 capsule 3  . ondansetron (ZOFRAN) 4 MG tablet Take 1-2 tablets (4-8 mg total) by mouth every 6 (six) hours as needed for nausea or vomiting. (Patient not taking: Reported on 05/18/2016) 30 tablet 2   Current Facility-Administered Medications on File Prior to Visit  Medication Dose Route Frequency Provider Last Rate Last Dose  . 0.9 %  sodium chloride infusion  500 mL Intravenous Continuous Hilarie Fredrickson, MD       No Known Allergies   Review of Systems  Constitutional: Negative for activity change, appetite change, chills, diaphoresis, fatigue and fever.  Respiratory: Negative for cough, chest tightness and shortness of breath.   Hematological: Negative  for adenopathy.      Objective:   Physical Exam  Constitutional: She is oriented to person, place, and time. She appears well-developed and well-nourished. No distress.  HENT:  Head: Normocephalic and atraumatic.  Eyes: Conjunctivae and EOM are normal.  Neck: Neck supple.  Cardiovascular: Normal rate.   Pulmonary/Chest: Effort normal. No respiratory distress.  Musculoskeletal: Normal range of motion.  Neurological: She is alert and oriented to person, place, and time.  Skin: Skin is warm and dry.  Psychiatric: She has a normal mood and affect. Her behavior is normal.  Nursing note and vitals reviewed.  BP 98/64   Pulse 69   Temp 98.4 F (36.9 C) (Oral)   Resp 16   Ht 5' 5.5" (1.664 m)   Wt 106 lb (48.1 kg)   LMP 06/29/2016   SpO2 100%   BMI 17.37 kg/m     Assessment & Plan:   1. Screening for tuberculosis   2. Need for prophylactic vaccination and inoculation against influenza   For CNA school.  Orders Placed This Encounter  Procedures  . Flu Vaccine QUAD 36+ mos IM  . Care order/instruction:    Scheduling Instructions:     Complete orders, AVS and go.  . TB Skin Test    Order Specific Question:   Has patient ever tested positive?    Answer:   No    Meds ordered this encounter  Medications  . etonogestrel (NEXPLANON) 68 MG  IMPL implant    Sig: 1 each by Subdermal route once.    I personally performed the services described in this documentation, which was scribed in my presence. The recorded information has been reviewed and considered, and addended by me as needed.   Norberto SorensonEva Kaiya Boatman, M.D.  Primary Care at [facility]  [place] [address] [place], [place] [address] [phone] phone [phone]) [phone] fax  09/11/16 1:53 PM

## 2016-09-08 ENCOUNTER — Ambulatory Visit (INDEPENDENT_AMBULATORY_CARE_PROVIDER_SITE_OTHER): Payer: Managed Care, Other (non HMO) | Admitting: Family Medicine

## 2016-09-08 DIAGNOSIS — Z111 Encounter for screening for respiratory tuberculosis: Secondary | ICD-10-CM | POA: Insufficient documentation

## 2016-09-08 LAB — TB SKIN TEST: TB SKIN TEST: NEGATIVE

## 2016-09-08 NOTE — Progress Notes (Signed)
Patient here for ppd read

## 2016-09-09 ENCOUNTER — Ambulatory Visit (INDEPENDENT_AMBULATORY_CARE_PROVIDER_SITE_OTHER): Payer: Managed Care, Other (non HMO) | Admitting: Neurology

## 2016-09-09 ENCOUNTER — Encounter: Payer: Self-pay | Admitting: Neurology

## 2016-09-09 VITALS — BP 105/67 | HR 59 | Ht 64.0 in | Wt 104.0 lb

## 2016-09-09 DIAGNOSIS — R253 Fasciculation: Secondary | ICD-10-CM

## 2016-09-09 DIAGNOSIS — R5382 Chronic fatigue, unspecified: Secondary | ICD-10-CM | POA: Diagnosis not present

## 2016-09-09 NOTE — Progress Notes (Addendum)
[facility]    Provider:  Dr Lucia Gaskins Referring Provider:  Primary Care Physician:   CC:  Muscle spasms  HPI:  Laura Mendoza is a 21 y.o. female here as a referral from Dr. Jerrell Mylar for generalized muscle spasms. PMHx muscle spasms, anxiety, chronic nausea.  Started 2 years ago. Intermittent. Recently worsened. Started 2 years ago in the back of the knees and has now progressed to the feet and all over the body and in the face, lips nose, eyebrows, cheeks, in the arms and neck and thorax. She actually videotaped it and showed it to me and it appears like benign fasciculation syndrome. No pain. Not progressive. Very disturbing but not painful or causing any deficits. She can feel it when she is trying to sleep. No weakness, dysphagia, vsiion changes, speech changes, numbness or tingling, weight loss. No caffeine use. The same every day, happens every day all day long, was better with diazepam, not worsened by stress but is worsened by anxiety. She feels helpless and can't make it stop. No inciting events or trauma. No other focal neurologic deficits, associated symptoms, inciting events or modifiable factors.  Reviewed notes, labs and imaging from outside physicians, which showed: Patient presented to the emergency room on 08/09/2016. Reviewed records here. She complained of intermittent moderate generalized muscle spasms that started several years ago. She says that problem is intermittent but it's recently worsened. Diazepam help with her symptoms. Denied any exacerbating factors. She also reported associated generalized myalgias with episodic spasms. But no other complaints. Neurologic exam was normal as well as physical exam.    Review of Systems: Patient complains of symptoms per HPI as well as the following symptoms: Fatigue, palpitations, constipation, feeling cold, joint pain, aching muscles, confusion, headache, restless legs, anxiety, not enough sleep,  decreased energy. Pertinent negatives per HPI. All others negative.   Social History   Social History  . Marital status: Single    Spouse name: N/A  . Number of children: 0  . Years of education: Some college   Occupational History  . N/A    Social History Main Topics  . Smoking status: Never Smoker  . Smokeless tobacco: Never Used  . Alcohol use No  . Drug use: No  . Sexual activity: Not on file   Other Topics Concern  . Not on file   Social History Narrative   Lives at home w/ her mom, sister, niece and nephew   Right-handed   Caffeine: sweet tea weekly    Family History  Problem Relation Age of Onset  . Hypertension Mother   . Irregular heart beat Mother   . Kidney Stones Father   . Hypertension Maternal Grandmother   . Kidney disease Maternal Grandmother   . Diabetes Maternal Grandmother   . Heart failure Maternal Grandmother   . Colon cancer Neg Hx   . Colon polyps Neg Hx   . Esophageal cancer Neg Hx   . Rectal cancer Neg Hx   . Stomach cancer Neg Hx   . Neuromuscular disorder Neg Hx     Past Medical History:  Diagnosis Date  . Anxiety     Past Surgical History:  Procedure Laterality Date  . NO PAST SURGERIES      Current Outpatient Prescriptions  Medication Sig Dispense Refill  . diazepam (VALIUM) 5 MG tablet Take 1 tablet (5 mg total) by mouth Nightly. 15 tablet 0  . etonogestrel (NEXPLANON) 68 MG IMPL implant 1 each by Subdermal route once.  Current Facility-Administered Medications  Medication Dose Route Frequency Provider Last Rate Last Dose  . 0.9 %  sodium chloride infusion  500 mL Intravenous Continuous Hilarie FredricksonJohn N Perry, MD        Allergies as of 09/09/2016  . (No Known Allergies)    Vitals: BP 105/67   Pulse (!) 59   Ht 5\' 4"  (1.626 m)   Wt 104 lb (47.2 kg)   BMI 17.85 kg/m  Last Weight:  Wt Readings from Last 1 Encounters:  09/09/16 104 lb (47.2 kg)   Last Height:   Ht Readings from Last 1 Encounters:  09/09/16 5\' 4"   (1.626 m)    Physical exam: Exam: Gen: NAD, conversant, well nourised, well groomed                     CV: RRR, no MRG. No Carotid Bruits. No peripheral edema, warm, nontender Eyes: Conjunctivae clear without exudates or hemorrhage  Neuro: Detailed Neurologic Exam  Speech:    Speech is normal; fluent and spontaneous with normal comprehension.  Cognition:    The patient is oriented to person, place, and time;     recent and remote memory intact;     language fluent;     normal attention, concentration,     fund of knowledge Cranial Nerves:    The pupils are equal, round, and reactive to light. The fundi are normal and spontaneous venous pulsations are present. Visual fields are full to finger confrontation. Extraocular movements are intact. Trigeminal sensation is intact and the muscles of mastication are normal. The face is symmetric. The palate elevates in the midline. Hearing intact. Voice is normal. Shoulder shrug is normal. The tongue has normal motion without fasciculations.   Coordination:    Normal finger to nose and heel to shin. Normal rapid alternating movements.   Gait:    Heel-toe and tandem gait are normal.   Motor Observation:    No asymmetry, no atrophy, and no involuntary movements noted. Tone:    Normal muscle tone.    Posture:    Posture is normal. normal erect    Strength:    Strength is V/V in the upper and lower limbs.      Sensation: intact to LT     Reflex Exam:  DTR's:    Deep tendon reflexes in the upper and lower extremities are normal bilaterally.   Toes:    The toes are downgoing bilaterally.   Clonus:    Clonus is absent.      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 Had a discussion about this, advised to limit caffeine, manage stress and anxiety. Neuro exam normal. Tried to reassure patient. We'll check labs today.   Cc: Dr. Violeta Gelinas'kelley  Vidyuth Belsito, MD  [facility] [address] [place], [place] [address]  Phone [phone] Fax [phone]

## 2016-09-09 NOTE — Patient Instructions (Addendum)
Remember to drink plenty of fluid, eat healthy meals and do not skip any meals. Try to eat protein with a every meal and eat a healthy snack such as fruit or nuts in between meals. Try to keep a regular sleep-wake schedule and try to exercise daily, particularly in the form of walking, 20-30 minutes a day, if you can.   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 Needs primary care and follow up in 4-6 weeks.  I would like to see you back as needed or if symptoms worsen. Please call us with any interim questions, concerns, problems, updates or refill requests.   Our phone number is [phone]. We also have an after hours call service for urgent matters and there is a physician on-call for urgent questions. For any emergencies you know to call 911 or go to the nearest emergency room

## 2016-09-10 ENCOUNTER — Telehealth: Payer: Self-pay

## 2016-09-10 ENCOUNTER — Encounter: Payer: Self-pay | Admitting: Neurology

## 2016-09-10 LAB — VITAMIN D 25 HYDROXY (VIT D DEFICIENCY, FRACTURES): Vit D, 25-Hydroxy: 25.7 ng/mL — ABNORMAL LOW (ref 30.0–100.0)

## 2016-09-10 LAB — CBC
Hematocrit: 40.6 % (ref 34.0–46.6)
Hemoglobin: 13.9 g/dL (ref 11.1–15.9)
MCH: 31.6 pg (ref 26.6–33.0)
MCHC: 34.2 g/dL (ref 31.5–35.7)
MCV: 92 fL (ref 79–97)
PLATELETS: 231 10*3/uL (ref 150–379)
RBC: 4.4 x10E6/uL (ref 3.77–5.28)
RDW: 13.2 % (ref 12.3–15.4)
WBC: 6.7 10*3/uL (ref 3.4–10.8)

## 2016-09-10 LAB — COMPREHENSIVE METABOLIC PANEL
A/G RATIO: 1.3 (ref 1.2–2.2)
ALBUMIN: 4.4 g/dL (ref 3.5–5.5)
ALT: 9 IU/L (ref 0–32)
AST: 13 IU/L (ref 0–40)
Alkaline Phosphatase: 64 IU/L (ref 39–117)
BILIRUBIN TOTAL: 0.3 mg/dL (ref 0.0–1.2)
BUN / CREAT RATIO: 8 — AB (ref 9–23)
BUN: 6 mg/dL (ref 6–20)
CALCIUM: 9.3 mg/dL (ref 8.7–10.2)
CHLORIDE: 104 mmol/L (ref 96–106)
CO2: 23 mmol/L (ref 18–29)
Creatinine, Ser: 0.71 mg/dL (ref 0.57–1.00)
GFR, EST AFRICAN AMERICAN: 142 mL/min/{1.73_m2} (ref >59)
GFR, EST NON AFRICAN AMERICAN: 123 mL/min/{1.73_m2} (ref >59)
GLOBULIN, TOTAL: 3.3 g/dL (ref 1.5–4.5)
Glucose: 102 mg/dL — ABNORMAL HIGH (ref 65–99)
POTASSIUM: 4.6 mmol/L (ref 3.5–5.2)
SODIUM: 142 mmol/L (ref 134–144)
TOTAL PROTEIN: 7.7 g/dL (ref 6.0–8.5)

## 2016-09-10 LAB — MAGNESIUM: Magnesium: 2 mg/dL (ref 1.6–2.3)

## 2016-09-10 LAB — TSH: TSH: 0.591 u[IU]/mL (ref 0.450–4.500)

## 2016-09-10 NOTE — Telephone Encounter (Signed)
-----   Message from Anson FretAntonia B Ahern, MD sent at 09/10/2016 11:41 AM EST ----- Her vitamin D is low. I would have her take some OTC vitamin D for 8 weeks and see if she feels better after that. thanks

## 2016-09-10 NOTE — Telephone Encounter (Signed)
Called pt w/ low vitamin D level. Instructed to take vitamin D3 1000 units x 8 wks. Verbalized understanding and appreciation for call.

## 2017-02-05 ENCOUNTER — Ambulatory Visit (HOSPITAL_COMMUNITY)
Admission: EM | Admit: 2017-02-05 | Discharge: 2017-02-05 | Disposition: A | Discharge status: Home / Self Care | Payer: Managed Care, Other (non HMO) | Attending: Internal Medicine | Admitting: Internal Medicine

## 2017-02-05 ENCOUNTER — Encounter (HOSPITAL_COMMUNITY): Payer: Self-pay | Admitting: Emergency Medicine

## 2017-02-05 DIAGNOSIS — Z833 Family history of diabetes mellitus: Secondary | ICD-10-CM | POA: Diagnosis not present

## 2017-02-05 DIAGNOSIS — R300 Dysuria: Secondary | ICD-10-CM | POA: Diagnosis not present

## 2017-02-05 DIAGNOSIS — Z79899 Other long term (current) drug therapy: Secondary | ICD-10-CM | POA: Diagnosis not present

## 2017-02-05 DIAGNOSIS — Z8249 Family history of ischemic heart disease and other diseases of the circulatory system: Secondary | ICD-10-CM | POA: Insufficient documentation

## 2017-02-05 DIAGNOSIS — N390 Urinary tract infection, site not specified: Secondary | ICD-10-CM | POA: Diagnosis present

## 2017-02-05 DIAGNOSIS — Z841 Family history of disorders of kidney and ureter: Secondary | ICD-10-CM | POA: Insufficient documentation

## 2017-02-05 DIAGNOSIS — F419 Anxiety disorder, unspecified: Secondary | ICD-10-CM | POA: Diagnosis not present

## 2017-02-05 LAB — POCT URINALYSIS DIP (DEVICE)
BILIRUBIN URINE: NEGATIVE
Glucose, UA: NEGATIVE mg/dL
LEUKOCYTES UA: NEGATIVE
Nitrite: NEGATIVE
Protein, ur: NEGATIVE mg/dL
SPECIFIC GRAVITY, URINE: 1.025 (ref 1.005–1.030)
UROBILINOGEN UA: 0.2 mg/dL (ref 0.0–1.0)
pH: 6 (ref 5.0–8.0)

## 2017-02-05 LAB — POCT PREGNANCY, URINE: Preg Test, Ur: NEGATIVE

## 2017-02-05 MED ORDER — PHENAZOPYRIDINE HCL 200 MG PO TABS: 200.0000 mg | ORAL_TABLET | Freq: Three times a day (TID) | ORAL | 0 refills | Status: DC | PRN

## 2017-02-05 MED ORDER — NAPROXEN 500 MG PO TABS: 500.0000 mg | ORAL_TABLET | Freq: Two times a day (BID) | ORAL | 0 refills | Status: DC

## 2017-02-05 NOTE — ED Provider Notes (Signed)
CSN: [id number]     Arrival date & time 02/05/17  1202 History   None    Chief Complaint  Patient presents with  . Urinary Tract Infection   (Consider location/radiation/quality/duration/timing/severity/associated sxs/prior Treatment) The history is provided by the patient.  Dysuria  Pain quality:  Burning Pain severity:  Mild Onset quality:  Gradual Duration:  4 days Timing:  Constant Progression:  Unchanged Chronicity:  Recurrent Recent urinary tract infections: no   Relieved by:  None tried Ineffective treatments:  None tried Urinary symptoms: no discolored urine, no foul-smelling urine, no frequent urination, no hematuria, no hesitancy and no bladder incontinence   Associated symptoms: abdominal pain   Associated symptoms: no fever, no flank pain, no genital lesions, no nausea, no vaginal discharge and no vomiting   Risk factors: sexually active   Risk factors: not pregnant     Past Medical History:  Diagnosis Date  . Anxiety    Past Surgical History:  Procedure Laterality Date  . NO PAST SURGERIES     Family History  Problem Relation Age of Onset  . Hypertension Mother   . Irregular heart beat Mother   . Kidney Stones Father   . Hypertension Maternal Grandmother   . Kidney disease Maternal Grandmother   . Diabetes Maternal Grandmother   . Heart failure Maternal Grandmother   . Colon cancer Neg Hx   . Colon polyps Neg Hx   . Esophageal cancer Neg Hx   . Rectal cancer Neg Hx   . Stomach cancer Neg Hx   . Neuromuscular disorder Neg Hx    Social History  Substance Use Topics  . Smoking status: Never Smoker  . Smokeless tobacco: Never Used  . Alcohol use No   OB History    No data available     Review of Systems  Constitutional: Negative for fatigue and fever.  HENT: Negative.   Respiratory: Negative.   Cardiovascular: Negative.   Gastrointestinal: Positive for abdominal pain. Negative for nausea and vomiting.  Genitourinary: Positive for dysuria.  Negative for flank pain and vaginal discharge.  Musculoskeletal: Negative.   Skin: Negative.   Neurological: Negative.     Allergies  Patient has no known allergies.  Home Medications   Prior to Admission medications   Medication Sig Start Date End Date Taking? Authorizing Provider  diazepam (VALIUM) 5 MG tablet Take 1 tablet (5 mg total) by mouth Nightly. 08/09/16   Nelva Nay, MD  naproxen (NAPROSYN) 500 MG tablet Take 1 tablet (500 mg total) by mouth 2 (two) times daily. 02/05/17   Dorena Bodo, NP  phenazopyridine (PYRIDIUM) 200 MG tablet Take 1 tablet (200 mg total) by mouth 3 (three) times daily as needed for pain. 02/05/17   Dorena Bodo, NP   Meds Ordered and Administered this Visit  Medications - No data to display  BP 111/61 (BP Location: Right Arm)   Temp 98.4 F (36.9 C) (Oral)   Resp 20   LMP 02/05/2017   SpO2 100%  No data found.   Physical Exam  Constitutional: She is oriented to person, place, and time. She appears well-developed and well-nourished. No distress.  HENT:  Head: Normocephalic and atraumatic.  Right Ear: External ear normal.  Left Ear: External ear normal.  Abdominal: Soft. Bowel sounds are normal. There is no tenderness. There is no CVA tenderness.  Neurological: She is alert and oriented to person, place, and time.  Skin: Skin is warm and dry. Capillary refill takes less than 2 seconds.  No rash noted. She is not diaphoretic. No erythema.  Psychiatric: She has a normal mood and affect. Her behavior is normal.  Nursing note and vitals reviewed.   Urgent Care Course     Procedures (including critical care time)  Labs Review Labs Reviewed  POCT URINALYSIS DIP (DEVICE) - Abnormal; Notable for the following:       Result Value   Ketones, ur TRACE (*)    Hgb urine dipstick LARGE (*)    All other components within normal limits  URINE CULTURE  POCT PREGNANCY, URINE  URINE CYTOLOGY ANCILLARY ONLY    Imaging Review No results  found.      MDM   1. Dysuria    UA results are not indicative of UTI, counseling provided to the patient. Started on Pyridium and Naprosyn, testing for gonorrhea, chlamydia, Trichomonas, BV yeast, urine culture also obtained, return to the clinic as needed if symptoms persist past one week    Dorena BodoKennard, Buena Boehm, NP 02/05/17 1415

## 2017-02-05 NOTE — ED Triage Notes (Signed)
Pt here for UTI sx onset 1 week associated w/vag irritation, urinary freq, abd pain  Denies fevers, dysuria  A&O x4... NAD... Ambulatory

## 2017-02-05 NOTE — Discharge Instructions (Signed)
If there are any positives on your urine tests, you will be notified of the results and treatment started.

## 2017-02-06 LAB — URINE CULTURE

## 2017-02-08 LAB — URINE CYTOLOGY ANCILLARY ONLY
Chlamydia: NEGATIVE
Neisseria Gonorrhea: NEGATIVE
Trichomonas: NEGATIVE

## 2017-02-10 LAB — URINE CYTOLOGY ANCILLARY ONLY: Candida vaginitis: NEGATIVE

## 2017-05-11 ENCOUNTER — Ambulatory Visit (HOSPITAL_COMMUNITY)
Admission: EM | Admit: 2017-05-11 | Discharge: 2017-05-11 | Disposition: A | Discharge status: Home / Self Care | Payer: Managed Care, Other (non HMO) | Attending: Family Medicine | Admitting: Family Medicine

## 2017-05-11 ENCOUNTER — Encounter (HOSPITAL_COMMUNITY): Payer: Self-pay | Admitting: *Deleted

## 2017-05-11 DIAGNOSIS — B3731 Acute candidiasis of vulva and vagina: Secondary | ICD-10-CM

## 2017-05-11 DIAGNOSIS — N764 Abscess of vulva: Secondary | ICD-10-CM

## 2017-05-11 DIAGNOSIS — B373 Candidiasis of vulva and vagina: Secondary | ICD-10-CM

## 2017-05-11 MED ORDER — MUPIROCIN CALCIUM 2 % EX CREA: 1.0000 "application " | TOPICAL_CREAM | Freq: Two times a day (BID) | CUTANEOUS | 0 refills | Status: DC

## 2017-05-11 MED ORDER — FLUCONAZOLE 150 MG PO TABS: 150.0000 mg | ORAL_TABLET | Freq: Once | ORAL | 0 refills | Status: AC

## 2017-05-11 NOTE — Discharge Instructions (Signed)
Call if you don't hear about your urine test in the next 3-5 business days.  Use the cream twice daily for 7-10 days.  If your issue does not resolve, follow up with your PCP.

## 2017-05-11 NOTE — ED Provider Notes (Addendum)
[facility]    CSN: [id number] Arrival date & time: 05/11/17  1152     History   Chief Complaint Chief Complaint  Patient presents with  . Vaginal Discharge    HPI Laura Mendoza is a 21 y.o. female.   HPI Bump in vaginal region for several days, painful, no drainage.  +vag D/C and itching. She is sexually active, no concerns from partner. She denies urinary complaints, fevers, abd pain, vag pain, odor. She was recently on abx for UTI and it started after that.   Past Medical History:  Diagnosis Date  . Anxiety     Patient Active Problem List   Diagnosis Date Noted  . 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 NO PAST SURGERIES      Home Medications    Prior to Admission medications   Medication Sig Start Date End Date Taking? Authorizing Provider  fluconazole (DIFLUCAN) 150 MG tablet Take 1 tablet (150 mg total) by mouth once. 05/11/17 05/11/17  Sharlene Dory, DO  mupirocin cream (BACTROBAN) 2 % Apply 1 application topically 2 (two) times daily. 05/11/17   Sharlene Dory, DO    Family History Family History  Problem Relation Age of Onset  . Hypertension Mother   . Irregular heart beat Mother   . Kidney Stones Father   . Hypertension Maternal Grandmother   . Kidney disease Maternal Grandmother   . Diabetes Maternal Grandmother   . Heart failure Maternal Grandmother   . Colon cancer Neg Hx   . Colon polyps Neg Hx   . Esophageal cancer Neg Hx   . Rectal cancer Neg Hx   . Stomach cancer Neg Hx   . Neuromuscular disorder Neg Hx     Social History Social History  Substance Use Topics  . Smoking status: Never Smoker  . Smokeless tobacco: Never Used  . Alcohol use No     Allergies   Patient has no known allergies.   Review of Systems Review of Systems  Constitutional: Negative for fever.  Genitourinary: Positive for vaginal discharge.      Physical Exam Updated Vital Signs BP 110/70 (BP Location: Left Arm)   Pulse 68   Temp 98.2 F (36.8 C) (Oral)   SpO2 100%   Physical Exam  Constitutional: She appears well-developed and well-nourished.  Cardiovascular: Normal rate and regular rhythm.   No murmur heard. Pulmonary/Chest: Effort normal and breath sounds normal. No respiratory distress.  Abdominal: Soft. Bowel sounds are normal. She exhibits no distension. There is no tenderness.  Skin: Skin is warm and dry.  L labia, there is a small area of hypopigmentation and induration, approx 3 mm in diameter, mild TTP, no fluctuance or drainage  Psychiatric: She has a normal mood and affect. Judgment normal.     UC Treatments / Results  Procedures Procedures   Initial Impression / Assessment and Plan / UC Course  I have reviewed the triage vital signs and the nursing notes.  Pertinent labs & imaging results that were available during my care of the patient were reviewed by me and considered in my medical decision making (see chart for details).     Pt presents with vag dc, symptoms consistent with yeast infection. Will treat. She also has a furuncle on labia, will treat w topical med. Will screen for STI's per pt request. Check MyChart. F/u with PCP prn. Pt voiced understanding and agreement to  the plan.  Final Clinical Impressions(s) / UC Diagnoses   Final diagnoses:  Furuncle of labia majora  Yeast vaginitis    New Prescriptions Discharge Medication List as of 05/11/2017  1:46 PM    START taking these medications   Details  fluconazole (DIFLUCAN) 150 MG tablet Take 1 tablet (150 mg total) by mouth once., Starting Tue 05/11/2017, Normal    mupirocin cream (BACTROBAN) 2 % Apply 1 application topically 2 (two) times daily., Starting Tue 05/11/2017, Normal         Controlled Substance Prescriptions [place] Controlled Substance Registry consulted? Not Applicable   Sharlene Dory, DO 05/11/17  1350    Sharlene Dory, [place] 05/11/17 [phone]

## 2017-05-11 NOTE — ED Triage Notes (Signed)
Patient states she thinks she may have a yeast infection. Also reports what she thinks was a hair bump in the vaginal region. She would like to be tested for stds as well.

## 2017-05-11 NOTE — ED Notes (Signed)
At bedside for physician evaluation of genital complaint.

## 2017-07-24 ENCOUNTER — Other Ambulatory Visit: Payer: Self-pay

## 2017-07-24 ENCOUNTER — Encounter (HOSPITAL_COMMUNITY): Payer: Self-pay | Admitting: Emergency Medicine

## 2017-07-24 DIAGNOSIS — R109 Unspecified abdominal pain: Secondary | ICD-10-CM | POA: Insufficient documentation

## 2017-07-24 DIAGNOSIS — R51 Headache: Secondary | ICD-10-CM | POA: Insufficient documentation

## 2017-07-24 DIAGNOSIS — R102 Pelvic and perineal pain: Secondary | ICD-10-CM | POA: Insufficient documentation

## 2017-07-24 DIAGNOSIS — N83201 Unspecified ovarian cyst, right side: Secondary | ICD-10-CM | POA: Insufficient documentation

## 2017-07-24 DIAGNOSIS — Z79899 Other long term (current) drug therapy: Secondary | ICD-10-CM | POA: Insufficient documentation

## 2017-07-24 LAB — URINALYSIS, ROUTINE W REFLEX MICROSCOPIC
Bilirubin Urine: NEGATIVE
Glucose, UA: NEGATIVE mg/dL
Ketones, ur: NEGATIVE mg/dL
Leukocytes, UA: NEGATIVE
NITRITE: NEGATIVE
Protein, ur: 30 mg/dL — AB
SPECIFIC GRAVITY, URINE: 1.02 (ref 1.005–1.030)
pH: 7 (ref 5.0–8.0)

## 2017-07-24 LAB — COMPREHENSIVE METABOLIC PANEL
ALBUMIN: 3.7 g/dL (ref 3.5–5.0)
ALT: 11 U/L — AB (ref 14–54)
AST: 18 U/L (ref 15–41)
Alkaline Phosphatase: 55 U/L (ref 38–126)
Anion gap: 6 (ref 5–15)
BILIRUBIN TOTAL: 0.5 mg/dL (ref 0.3–1.2)
BUN: 7 mg/dL (ref 6–20)
CO2: 25 mmol/L (ref 22–32)
CREATININE: 0.73 mg/dL (ref 0.44–1.00)
Calcium: 9.1 mg/dL (ref 8.9–10.3)
Chloride: 108 mmol/L (ref 101–111)
GFR calc Af Amer: >60 mL/min (ref >60)
GFR calc non Af Amer: >60 mL/min (ref >60)
GLUCOSE: 82 mg/dL (ref 65–99)
POTASSIUM: 3.8 mmol/L (ref 3.5–5.1)
Sodium: 139 mmol/L (ref 135–145)
Total Protein: 7.5 g/dL (ref 6.5–8.1)

## 2017-07-24 LAB — I-STAT BETA HCG BLOOD, ED (MC, WL, AP ONLY): I-stat hCG, quantitative: <5 m[IU]/mL (ref <5)

## 2017-07-24 LAB — CBC
HEMATOCRIT: 40 % (ref 36.0–46.0)
Hemoglobin: 12.9 g/dL (ref 12.0–15.0)
MCH: 30.1 pg (ref 26.0–34.0)
MCHC: 32.3 g/dL (ref 30.0–36.0)
MCV: 93.5 fL (ref 78.0–100.0)
PLATELETS: 259 10*3/uL (ref 150–400)
RBC: 4.28 MIL/uL (ref 3.87–5.11)
RDW: 13.8 % (ref 11.5–15.5)
WBC: 6 10*3/uL (ref 4.0–10.5)

## 2017-07-24 LAB — LIPASE, BLOOD: Lipase: 32 U/L (ref 11–51)

## 2017-07-24 NOTE — ED Triage Notes (Signed)
Patient arrives with 1 week of pelvic pain pain with accompanying left flank pain starting a few days after. Denies history of problems with UTI. Also endorses nausea and headache. Denies dysuria.

## 2017-07-25 ENCOUNTER — Emergency Department (HOSPITAL_COMMUNITY)
Admission: EM | Admit: 2017-07-25 | Discharge: 2017-07-25 | Disposition: A | Discharge status: Home / Self Care | Payer: Self-pay | Attending: Emergency Medicine | Admitting: Emergency Medicine

## 2017-07-25 ENCOUNTER — Emergency Department (HOSPITAL_COMMUNITY): Payer: Self-pay

## 2017-07-25 DIAGNOSIS — R109 Unspecified abdominal pain: Secondary | ICD-10-CM

## 2017-07-25 DIAGNOSIS — N83201 Unspecified ovarian cyst, right side: Secondary | ICD-10-CM

## 2017-07-25 MED ORDER — KETOROLAC TROMETHAMINE 60 MG/2ML IM SOLN
60.0000 mg | Freq: Once | INTRAMUSCULAR | Status: AC
  Administered 2017-07-25: 60 mg via INTRAMUSCULAR
  Filled 2017-07-25: qty 2

## 2017-07-25 MED ORDER — TRAMADOL HCL 50 MG PO TABS: 50.0000 mg | ORAL_TABLET | Freq: Four times a day (QID) | ORAL | 0 refills | Status: DC | PRN

## 2017-07-25 NOTE — ED Provider Notes (Signed)
[facility] Provider Note   CSN: [id number] Arrival date & time: 07/24/17  2002     History   Chief Complaint Chief Complaint  Patient presents with  . Pelvic Pain  . Flank Pain    HPI Laura Mendoza is a 21 y.o. female.  Patient is a 21 year old female with no significant past medical history.  She presents today for evaluation of left flank pain.  Is been ongoing for 1 week.  Her pain is intermittent but described as severe.  She denies any vaginal discharge, urinary complaints, or bowel issues.  She denies any fevers or chills.  She is currently on her menstrual period and this started yesterday morning.   The history is provided by the patient.  Pelvic Pain  This is a new problem. Episode onset: 1 week ago. Episode frequency: Intermittently. The problem has been gradually worsening. Associated symptoms include headaches. Pertinent negatives include no chest pain and no abdominal pain. Nothing aggravates the symptoms. Nothing relieves the symptoms. She has tried nothing for the symptoms.  Flank Pain  Associated symptoms include headaches. Pertinent negatives include no chest pain and no abdominal pain.    Past Medical History:  Diagnosis Date  . Anxiety     Patient Active Problem List   Diagnosis Date Noted  . 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 NO PAST SURGERIES      OB History    No data available       Home Medications    Prior to Admission medications   Medication Sig Start Date End Date Taking? Authorizing Provider  mupirocin cream (BACTROBAN) 2 % Apply 1 application topically 2 (two) times daily. 05/11/17   Sharlene DoryWendling, Nicholas Paul, DO    Family History Family History  Problem Relation Age of Onset  . Hypertension Mother   . Irregular heart beat Mother   . Kidney Stones Father   . Hypertension Maternal Grandmother   . Kidney  disease Maternal Grandmother   . Diabetes Maternal Grandmother   . Heart failure Maternal Grandmother   . Colon cancer Neg Hx   . Colon polyps Neg Hx   . Esophageal cancer Neg Hx   . Rectal cancer Neg Hx   . Stomach cancer Neg Hx   . Neuromuscular disorder Neg Hx     Social History Social History   Tobacco Use  . Smoking status: Never Smoker  . Smokeless tobacco: Never Used  Substance Use Topics  . Alcohol use: No  . Drug use: No     Allergies   Patient has no known allergies.   Review of Systems Review of Systems  Cardiovascular: Negative for chest pain.  Gastrointestinal: Negative for abdominal pain.  Genitourinary: Positive for flank pain and pelvic pain.  Neurological: Positive for headaches.  All other systems reviewed and are negative.    Physical Exam Updated Vital Signs BP 102/66 (BP Location: Right Arm)   Pulse 63   Temp 98.3 F (36.8 C) (Oral)   Resp 16   LMP 07/24/2017 (Exact Date)   SpO2 100%   Physical Exam  Constitutional: She is oriented to person, place, and time. She appears well-developed and well-nourished. No distress.  HENT:  Head: Normocephalic and atraumatic.  Neck: Normal range of motion. Neck supple.  Cardiovascular: Normal rate and regular rhythm. Exam reveals no gallop and no friction rub.  No murmur heard. Pulmonary/Chest: Effort normal  and breath sounds normal. No respiratory distress. She has no wheezes.  Abdominal: Soft. Bowel sounds are normal. She exhibits no distension. There is no tenderness.  Musculoskeletal: Normal range of motion.  Neurological: She is alert and oriented to person, place, and time. No cranial nerve deficit. Coordination normal.  Skin: Skin is warm and dry. She is not diaphoretic.  Nursing note and vitals reviewed.    ED Treatments / Results  Labs (all labs ordered are listed, but only abnormal results are displayed) Labs Reviewed  COMPREHENSIVE METABOLIC PANEL - Abnormal; Notable for the  following components:      Result Value   ALT 11 (*)    All other components within normal limits  URINALYSIS, ROUTINE W REFLEX MICROSCOPIC - Abnormal; Notable for the following components:   APPearance HAZY (*)    Hgb urine dipstick LARGE (*)    Protein, ur 30 (*)    Bacteria, UA MANY (*)    Squamous Epithelial / LPF 0-5 (*)    All other components within normal limits  LIPASE, BLOOD  CBC  I-STAT BETA HCG BLOOD, ED ([facility], [facility], [facility] ONLY)    EKG  EKG Interpretation None       Radiology No results found.  Procedures Procedures (including critical care time)  Medications Ordered in ED Medications  ketorolac (TORADOL) injection 60 mg (not administered)     Initial Impression / Assessment and Plan / ED Course  I have reviewed the triage vital signs and the nursing notes.  Pertinent labs & imaging results that were available during my care of the patient were reviewed by me and considered in my medical decision making (see chart for details).  Patient presents with complaints of left flank pain.  She does have some hematuria, however is currently menstruating.  There is no evidence for infection in her urine and CT scan shows no evidence for renal calculus.  She does have what appears to be possibly a small dermoid cyst on the right which the radiologist recommends further characterization with an ultrasound if symptoms persist.  Since her pain is on the left, I doubt that the right cyst is causing her pain, however will advise her to follow-up with her primary doctor to have this reevaluated in the upcoming months.  Final Clinical Impressions(s) / ED Diagnoses   Final diagnoses:  None    ED Discharge Orders    None       Geoffery Lyonselo, Jahmia Berrett, MD 07/25/17 0210

## 2017-07-25 NOTE — Discharge Instructions (Signed)
Ibuprofen 600 mg every 6 hours as needed for pain.  Tramadol as prescribed as needed for pain not relieved with ibuprofen.  Follow-up with a primary doctor to discuss an ultrasound as an outpatient if your symptoms persist.  Return to the emergency department if you develop worsening pain, high fevers, bloody stools, or other new and concerning symptoms.

## 2017-09-01 ENCOUNTER — Emergency Department (HOSPITAL_COMMUNITY)
Admission: EM | Admit: 2017-09-01 | Discharge: 2017-09-01 | Disposition: A | Discharge status: Home / Self Care | Payer: Self-pay | Attending: Emergency Medicine | Admitting: Emergency Medicine

## 2017-09-01 ENCOUNTER — Encounter (HOSPITAL_COMMUNITY): Payer: Self-pay

## 2017-09-01 ENCOUNTER — Other Ambulatory Visit: Payer: Self-pay

## 2017-09-01 ENCOUNTER — Emergency Department (HOSPITAL_COMMUNITY): Payer: Self-pay

## 2017-09-01 DIAGNOSIS — Y998 Other external cause status: Secondary | ICD-10-CM | POA: Insufficient documentation

## 2017-09-01 DIAGNOSIS — R600 Localized edema: Secondary | ICD-10-CM | POA: Insufficient documentation

## 2017-09-01 DIAGNOSIS — Y929 Unspecified place or not applicable: Secondary | ICD-10-CM | POA: Insufficient documentation

## 2017-09-01 DIAGNOSIS — Z79899 Other long term (current) drug therapy: Secondary | ICD-10-CM | POA: Insufficient documentation

## 2017-09-01 DIAGNOSIS — W109XXA Fall (on) (from) unspecified stairs and steps, initial encounter: Secondary | ICD-10-CM | POA: Insufficient documentation

## 2017-09-01 DIAGNOSIS — Y939 Activity, unspecified: Secondary | ICD-10-CM | POA: Insufficient documentation

## 2017-09-01 DIAGNOSIS — M79671 Pain in right foot: Secondary | ICD-10-CM | POA: Insufficient documentation

## 2017-09-01 MED ORDER — IBUPROFEN 400 MG PO TABS
600.0000 mg | ORAL_TABLET | Freq: Once | ORAL | Status: AC
  Administered 2017-09-01: 600 mg via ORAL
  Filled 2017-09-01: qty 1

## 2017-09-01 NOTE — ED Triage Notes (Signed)
Patient complains of right foot pain after and falling down step last night. No deformity, no swelling

## 2017-09-01 NOTE — ED Provider Notes (Signed)
[facility] Provider Note   CSN: [id number] Arrival date & time: 09/01/17  [id number]     History   Chief Complaint No chief complaint on file.   HPI Laura Mendoza is a 22 y.o. female presents for evaluation of right foot pain.  She reports that around 1 AM she had a mechanical trip and fall injuring her right foot.  She denies striking her head or any other injuries from this fall.  She has tried putting a cold rag on her foot with mild relief of pain.  She reports that her pain is 10 out of 10, and she has not been able to bear weight on it.    HPI  Past Medical History:  Diagnosis Date  . Anxiety     Patient Active Problem List   Diagnosis Date Noted  . 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 NO PAST SURGERIES      OB History    No data available       Home Medications    Prior to Admission medications   Medication Sig Start Date End Date Taking? Authorizing Provider  mupirocin cream (BACTROBAN) 2 % Apply 1 application topically 2 (two) times daily. 05/11/17   Sharlene DoryWendling, Nicholas Paul, DO  traMADol (ULTRAM) 50 MG tablet Take 1 tablet (50 mg total) by mouth every 6 (six) hours as needed. 07/25/17   Geoffery Lyonselo, Douglas, MD    Family History Family History  Problem Relation Age of Onset  . Hypertension Mother   . Irregular heart beat Mother   . Kidney Stones Father   . Hypertension Maternal Grandmother   . Kidney disease Maternal Grandmother   . Diabetes Maternal Grandmother   . Heart failure Maternal Grandmother   . Colon cancer Neg Hx   . Colon polyps Neg Hx   . Esophageal cancer Neg Hx   . Rectal cancer Neg Hx   . Stomach cancer Neg Hx   . Neuromuscular disorder Neg Hx     Social History Social History   Tobacco Use  . Smoking status: Never Smoker  . Smokeless tobacco: Never Used  Substance Use Topics  . Alcohol use: No  . Drug use: No      Allergies   Patient has no known allergies.   Review of Systems Review of Systems  Musculoskeletal: Positive for arthralgias.  Skin: Negative for wound.  Neurological: Negative for headaches.  All other systems reviewed and are negative.    Physical Exam Updated Vital Signs BP 123/84   Temp 98.8 F (37.1 C) (Oral)   Resp 18   LMP 08/27/2017   SpO2 99%   Physical Exam  Constitutional: She appears well-developed and well-nourished.  HENT:  Head: Normocephalic and atraumatic.  Cardiovascular: Intact distal pulses.  2+ DP/PT pulses bilaterally.  Pulmonary/Chest: No respiratory distress.  Musculoskeletal:  Very minimal swelling present to right lateral foot.  Palpation over midfoot both re-creates and exacerbates her reported pain.  No obvious crepitus, deformities or ecchymosis.  Neurological: She is alert.  Sensation intact to bilateral feet.  Skin: Capillary refill takes less than 2 seconds. She is not diaphoretic.  Skin on right foot/ankle is warm, brisk cap refill, no obvious wounds.  Small bruise on left anterior tibia without obvious skin break.  Nursing note and vitals reviewed.    ED Treatments / Results  Labs (all labs ordered are listed, but only abnormal  results are displayed) Labs Reviewed - No data to display  EKG  EKG Interpretation None       Radiology Dg Foot Complete Right  Result Date: 09/01/2017 CLINICAL DATA:  Laura Mendoza down stairs 2 days ago with mid foot pain EXAM: RIGHT FOOT COMPLETE - 3+ VIEW COMPARISON:  None. FINDINGS: Tarsal-metatarsal alignment is normal. Joint spaces appear normal. No fracture or dislocation is seen. IMPRESSION: Negative. Electronically Signed   By: Dwyane Dee M.D.   On: 09/01/2017 08:49    Procedures Procedures (including critical care time)  Medications Ordered in ED Medications  ibuprofen (ADVIL,MOTRIN) tablet 600 mg (not administered)     Initial Impression / Assessment and Plan / ED Course  I have  reviewed the triage vital signs and the nursing notes.  Pertinent labs & imaging results that were available during my care of the patient were reviewed by me and considered in my medical decision making (see chart for details).    Laura Mendoza Presents with right foot pain after a mechanical slip and fall last night consistent with a foot sprain/strain.  The affected foot has mild edema and is tender across the mid foot.  X-rays were obtained with out acute abnormality. The skin is intact to ankle/foot.  The foot is warm and well perfused with intact sensation.  Motor function is limited secondary to pain.  Patient given instructions for OTC pain medication, to wear supportive shoes, ACE wrap and crutches.  Patient advised to follow up with PCP if symptoms persist for longer than one week.  Patient was given the option to ask questions, all of which were answered to the best of my ability.  Patient is agreeable for discharge.    Final Clinical Impressions(s) / ED Diagnoses   Final diagnoses:  Right foot pain    ED Discharge Orders    None       [name] 09/01/17 1610    Gwyneth Sprout, MD 09/01/17 [phone]

## 2017-09-01 NOTE — Discharge Instructions (Signed)
Today your x-rays did not show a broken bones in your foot.   Please take Ibuprofen (Advil, motrin) and Tylenol (acetaminophen) to relieve your pain.  You may take up to 600 MG (3 pills) of normal strength ibuprofen every 8 hours as needed.  In between doses of ibuprofen you make take tylenol, up to 1,000 mg (two extra strength pills).  Do not take more than 3,000 mg tylenol in a 24 hour period.  Please check all medication labels as many medications such as pain and cold medications may contain tylenol.  Do not drink alcohol while taking these medications.  Do not take other NSAID'S while taking ibuprofen (such as aleve or naproxen).  Please take ibuprofen with food to decrease stomach upset.

## 2017-09-23 ENCOUNTER — Inpatient Hospital Stay (HOSPITAL_COMMUNITY)
Admission: AD | Admit: 2017-09-23 | Discharge: 2017-09-23 | Disposition: A | Discharge status: Home / Self Care | Payer: Medicaid Other | Source: Ambulatory Visit | Attending: Obstetrics & Gynecology | Admitting: Obstetrics & Gynecology

## 2017-09-23 ENCOUNTER — Encounter (HOSPITAL_COMMUNITY): Payer: Self-pay

## 2017-09-23 DIAGNOSIS — Z3202 Encounter for pregnancy test, result negative: Secondary | ICD-10-CM | POA: Insufficient documentation

## 2017-09-23 DIAGNOSIS — N946 Dysmenorrhea, unspecified: Secondary | ICD-10-CM | POA: Insufficient documentation

## 2017-09-23 DIAGNOSIS — R1030 Lower abdominal pain, unspecified: Secondary | ICD-10-CM | POA: Insufficient documentation

## 2017-09-23 HISTORY — DX: Chlamydial infection, unspecified: A74.9

## 2017-09-23 HISTORY — DX: Acute candidiasis of vulva and vagina: B37.31

## 2017-09-23 HISTORY — DX: Candidiasis of vulva and vagina: B37.3

## 2017-09-23 HISTORY — DX: Urinary tract infection, site not specified: N39.0

## 2017-09-23 LAB — URINALYSIS, ROUTINE W REFLEX MICROSCOPIC
Bilirubin Urine: NEGATIVE
GLUCOSE, UA: NEGATIVE mg/dL
Ketones, ur: NEGATIVE mg/dL
Leukocytes, UA: NEGATIVE
NITRITE: NEGATIVE
Protein, ur: NEGATIVE mg/dL
Specific Gravity, Urine: 1.027 (ref 1.005–1.030)
pH: 6 (ref 5.0–8.0)

## 2017-09-23 LAB — CBC
HCT: 41.2 % (ref 36.0–46.0)
Hemoglobin: 14 g/dL (ref 12.0–15.0)
MCH: 31.6 pg (ref 26.0–34.0)
MCHC: 34 g/dL (ref 30.0–36.0)
MCV: 93 fL (ref 78.0–100.0)
PLATELETS: 240 10*3/uL (ref 150–400)
RBC: 4.43 MIL/uL (ref 3.87–5.11)
RDW: 13 % (ref 11.5–15.5)
WBC: 7.7 10*3/uL (ref 4.0–10.5)

## 2017-09-23 LAB — WET PREP, GENITAL
SPERM: NONE SEEN
TRICH WET PREP: NONE SEEN
Yeast Wet Prep HPF POC: NONE SEEN

## 2017-09-23 LAB — POCT PREGNANCY, URINE: Preg Test, Ur: NEGATIVE

## 2017-09-23 NOTE — MAU Provider Note (Signed)
History     CSN: [id number]  Arrival date and time: 09/23/17 9323   First Provider Initiated Contact with Patient 09/23/17 1038      Chief Complaint  Patient presents with  . Abdominal Pain   HPI Laura Mendoza 22 y.o. Marland KitchenComes to MAU today as she is having lower abdominal cramping and has started having light vaginal bleeding but it is not yet time for her period.  Her last period was very heavy.  She thinks she might have an ovarian cyst.  Was seen at the ER early in Feb for her foot and in December for abdominal pain and back pain.  Reviewed those notes.  Had renal ultrasound in December and saw a suspected cyst 2.2 cm that might be a dermoid.   Currently using condoms for contraception but not with every intercourse. Has taken ibuprofen 400 mg for pain - yesterday was the last time.    G0P0  Past Medical History:  Diagnosis Date  . Anxiety   . Chlamydia 2016  . UTI (urinary tract infection)   . Vaginal yeast infection     Past Surgical History:  Procedure Laterality Date  . NO PAST SURGERIES      Family History  Problem Relation Age of Onset  . Hypertension Mother   . Irregular heart beat Mother   . Kidney Stones Father   . Hypertension Maternal Grandmother   . Kidney disease Maternal Grandmother   . Diabetes Maternal Grandmother   . Heart failure Maternal Grandmother   . Colon cancer Neg Hx   . Colon polyps Neg Hx   . Esophageal cancer Neg Hx   . Rectal cancer Neg Hx   . Stomach cancer Neg Hx   . Neuromuscular disorder Neg Hx     Social History   Tobacco Use  . Smoking status: Never Smoker  . Smokeless tobacco: Never Used  Substance Use Topics  . Alcohol use: No  . Drug use: No    Allergies: No Known Allergies  Medications Prior to Admission  Medication Sig Dispense Refill Last Dose  . ibuprofen (ADVIL,MOTRIN) 200 MG tablet Take 200 mg by mouth every 6 (six) hours as needed for headache.   prn  . mupirocin cream (BACTROBAN) 2 % Apply 1  application topically 2 (two) times daily. 15 g 0   . traMADol (ULTRAM) 50 MG tablet Take 1 tablet (50 mg total) by mouth every 6 (six) hours as needed. 10 tablet 0     Review of Systems  Constitutional: Negative for fever.  Gastrointestinal: Positive for abdominal pain. Negative for diarrhea, nausea and vomiting.  Genitourinary: Positive for vaginal bleeding. Negative for urgency and vaginal discharge.       History of heavy menses and severe cramping last month   Physical Exam   Blood pressure 109/69, pulse 79, temperature 99 F (37.2 C), temperature source Oral, resp. rate 18, height 5\' 4"  (1.626 m), weight 105 lb (47.6 kg), last menstrual period 08/28/2017.  Physical Exam  Nursing note and vitals reviewed. Constitutional: She is oriented to person, place, and time. She appears well-developed and well-nourished.  HENT:  Head: Normocephalic.  Eyes: EOM are normal.  Neck: Neck supple.  GI: Soft. Bowel sounds are normal. There is no tenderness. There is no rebound and no guarding.  Genitourinary:  Genitourinary Comments: Speculum exam: Timid with exam - encouraged to relax Vagina - Scant amount of red blood discharge, no odor Cervix - No active bleeding, on the left side.  Bimanual exam: Cervix closed Uterus non tender, normal size, slightly on the left Adnexa non tender, no masses bilaterally GC/Chlam, wet prep done Chaperone present for exam.   Musculoskeletal: Normal range of motion.  Neurological: She is alert and oriented to person, place, and time.  Skin: Skin is warm and dry.  Psychiatric: She has a normal mood and affect.    MAU Course  Procedures Results for orders placed or performed during the hospital encounter of 09/23/17 (from the past 24 hour(s))  Urinalysis, Routine w reflex microscopic     Status: Abnormal   Collection Time: 09/23/17  9:54 AM  Result Value Ref Range   Color, Urine YELLOW YELLOW   APPearance HAZY (A) CLEAR   Specific Gravity, Urine 1.027  1.005 - 1.030   pH 6.0 5.0 - 8.0   Glucose, UA NEGATIVE NEGATIVE mg/dL   Hgb urine dipstick MODERATE (A) NEGATIVE   Bilirubin Urine NEGATIVE NEGATIVE   Ketones, ur NEGATIVE NEGATIVE mg/dL   Protein, ur NEGATIVE NEGATIVE mg/dL   Nitrite NEGATIVE NEGATIVE   Leukocytes, UA NEGATIVE NEGATIVE   RBC / HPF 0-5 0 - 5 RBC/hpf   WBC, UA 0-5 0 - 5 WBC/hpf   Bacteria, UA RARE (A) NONE SEEN   Squamous Epithelial / LPF 0-5 (A) NONE SEEN   Mucus PRESENT   Pregnancy, urine POC     Status: None   Collection Time: 09/23/17 10:15 AM  Result Value Ref Range   Preg Test, Ur NEGATIVE NEGATIVE  CBC     Status: None   Collection Time: 09/23/17 10:52 AM  Result Value Ref Range   WBC 7.7 4.0 - 10.5 K/uL   RBC 4.43 3.87 - 5.11 MIL/uL   Hemoglobin 14.0 12.0 - 15.0 g/dL   HCT 40.9 81.1 - 91.4 %   MCV 93.0 78.0 - 100.0 fL   MCH 31.6 26.0 - 34.0 pg   MCHC 34.0 30.0 - 36.0 g/dL   RDW 78.2 95.6 - 21.3 %   Platelets 240 150 - 400 K/uL  Wet prep, genital     Status: Abnormal   Collection Time: 09/23/17 10:56 AM  Result Value Ref Range   Yeast Wet Prep HPF POC NONE SEEN NONE SEEN   Trich, Wet Prep NONE SEEN NONE SEEN   Clue Cells Wet Prep HPF POC PRESENT (A) NONE SEEN   WBC, Wet Prep HPF POC FEW (A) NONE SEEN   Sperm NONE SEEN     MDM Thinks she is having problems with ovarian cysts as told by the ER she likely had a cyst, but on exam today has no tenderness with exam and no masses palpated on exam.  Stopped her Nexplanon approx a year ago and her menses have been regular until last month when they were heavier and this month when her menses started early (due on March 1) but have been light with lost of cramping - will evaluate for STD.  Also advised her to make an appointment [facility] for [facility] services so she can have a care provider for contraceptive services.  No acute process here today but may need follow up for possible small dermoid cyst - can get an ultrasound at the Health  Department less expensively as her insurance is [employer] and will not cover an ultrasound.  Due to current vaginal bleeding and no complaint of problems with vaginal discharge - will not treat clue cells seen on wet prep today as unable to fully evaluate  for BV and client does not have complaints consistent with BV.  Assessment and Plan  Dysmenorrhea Possible dermoid cyst  Plan [facility] which can help your periods be less painful.  It is time for a pap smear. Also you can arrange a pelvic ultrasound to follow up the possible ovarian cyst seen on the renal CT in December. Labs pending. Continue condoms with every intercourse unless you ware wanting to be pregnant. At the [facility] Department you can be scheduled for a ultrasound to reevaluate the possible ovarian cyst. You can take Ibuprofen 3 tablets by mouth every 6 hours as needed for pain.  Take with food or crackers.  Laura Mendoza 09/23/2017, 11:01 AM

## 2017-09-23 NOTE — Discharge Instructions (Signed)
Be seen at the [facility] Department for a full evaluation - you need a pap smear.  Contraception may help with having a less painful period.   At the [facility] Department you can be scheduled for a ultrasound to reevaluate the possible ovarian cyst. You can take Ibuprofen 3 tablets by mouth every 6 hours as needed for pain.  Take with food or crackers. You have lab tests that are pending and we will call you if additional treatment is needed.   [facility] late 2019[facility]. At that time, the MAU (Maternity Admissions Unit), where you are being seen today, will no longer take care of non-pregnant patients. We strongly encourage you to find a doctor's office before that time, so that you can be seen with any GYN concerns, like vaginal discharge, urinary tract infection, etc.. in a timely manner.  In order to make an office visit more convenient, the Center [facility] will be offering evening hours with same-day appointments, walk-in appointments and scheduled appointments available during this time.  Center [facility] @ [facility] Hours: Monday - 8am - 7:30 pm with walk-in between 4pm- 7:30 pm Tuesday - 8 am - 5 pm (starting 10/26/17 we will be open late and accepting walk-ins from 4pm - 7:30pm) Wednesday - 8 am - 5 pm (starting 01/26/18 we will be open late and accepting walk-ins from 4pm - 7:30pm) Thursday 8 am - 5 pm (starting 04/28/18 we will be open late and accepting walk-ins from 4pm - 7:30pm) Friday 8 am - 5 pm  For an appointment please call the Center for [facility] at [phone]  For urgent needs, [facility] also available for management of urgent GYN complaints such as vaginal discharge or urinary tract infections.

## 2017-09-23 NOTE — MAU Note (Signed)
Pt was told she had an ovarian cyst 2 months ago.  Had a heavy period last month and her period that started yesterday is lighter than usual. C/o abd cramps with her periods. Has not tried any pain medication for cramps.

## 2017-09-24 LAB — GC/CHLAMYDIA PROBE AMP (~~LOC~~) NOT AT ARMC
Chlamydia: NEGATIVE
Neisseria Gonorrhea: NEGATIVE

## 2017-09-24 LAB — RPR: RPR Ser Ql: NONREACTIVE

## 2017-09-24 LAB — HIV ANTIBODY (ROUTINE TESTING W REFLEX): HIV Screen 4th Generation wRfx: NONREACTIVE

## 2017-11-09 ENCOUNTER — Emergency Department (HOSPITAL_COMMUNITY)
Admission: EM | Admit: 2017-11-09 | Discharge: 2017-11-10 | Disposition: A | Discharge status: Home / Self Care | Payer: Self-pay | Attending: Emergency Medicine | Admitting: Emergency Medicine

## 2017-11-09 ENCOUNTER — Encounter (HOSPITAL_COMMUNITY): Payer: Self-pay

## 2017-11-09 ENCOUNTER — Other Ambulatory Visit: Payer: Self-pay

## 2017-11-09 DIAGNOSIS — R1030 Lower abdominal pain, unspecified: Secondary | ICD-10-CM

## 2017-11-09 DIAGNOSIS — N760 Acute vaginitis: Secondary | ICD-10-CM | POA: Insufficient documentation

## 2017-11-09 DIAGNOSIS — R110 Nausea: Secondary | ICD-10-CM | POA: Insufficient documentation

## 2017-11-09 DIAGNOSIS — R51 Headache: Secondary | ICD-10-CM | POA: Insufficient documentation

## 2017-11-09 DIAGNOSIS — B9689 Other specified bacterial agents as the cause of diseases classified elsewhere: Secondary | ICD-10-CM

## 2017-11-09 DIAGNOSIS — H53149 Visual discomfort, unspecified: Secondary | ICD-10-CM | POA: Insufficient documentation

## 2017-11-09 DIAGNOSIS — R300 Dysuria: Secondary | ICD-10-CM | POA: Insufficient documentation

## 2017-11-09 DIAGNOSIS — R519 Headache, unspecified: Secondary | ICD-10-CM

## 2017-11-09 LAB — I-STAT BETA HCG BLOOD, ED (MC, WL, AP ONLY): I-stat hCG, quantitative: <5 m[IU]/mL (ref <5)

## 2017-11-09 LAB — URINALYSIS, ROUTINE W REFLEX MICROSCOPIC
Bacteria, UA: NONE SEEN
Bilirubin Urine: NEGATIVE
Glucose, UA: NEGATIVE mg/dL
KETONES UR: NEGATIVE mg/dL
Leukocytes, UA: NEGATIVE
Nitrite: NEGATIVE
PH: 5 (ref 5.0–8.0)
Protein, ur: NEGATIVE mg/dL
Specific Gravity, Urine: 1.018 (ref 1.005–1.030)

## 2017-11-09 LAB — CBC WITH DIFFERENTIAL/PLATELET
BASOS PCT: 0 %
Basophils Absolute: 0 10*3/uL (ref 0.0–0.1)
Eosinophils Absolute: 0.1 10*3/uL (ref 0.0–0.7)
Eosinophils Relative: 1 %
HEMATOCRIT: 41.8 % (ref 36.0–46.0)
HEMOGLOBIN: 13.9 g/dL (ref 12.0–15.0)
LYMPHS PCT: 48 %
Lymphs Abs: 3.2 10*3/uL (ref 0.7–4.0)
MCH: 31.2 pg (ref 26.0–34.0)
MCHC: 33.3 g/dL (ref 30.0–36.0)
MCV: 93.9 fL (ref 78.0–100.0)
MONO ABS: 0.4 10*3/uL (ref 0.1–1.0)
Monocytes Relative: 6 %
NEUTROS ABS: 3 10*3/uL (ref 1.7–7.7)
NEUTROS PCT: 45 %
Platelets: 277 10*3/uL (ref 150–400)
RBC: 4.45 MIL/uL (ref 3.87–5.11)
RDW: 12.7 % (ref 11.5–15.5)
WBC: 6.7 10*3/uL (ref 4.0–10.5)

## 2017-11-09 LAB — COMPREHENSIVE METABOLIC PANEL
ALBUMIN: 4 g/dL (ref 3.5–5.0)
ALK PHOS: 58 U/L (ref 38–126)
ALT: 12 U/L — AB (ref 14–54)
AST: 18 U/L (ref 15–41)
Anion gap: 10 (ref 5–15)
BUN: 8 mg/dL (ref 6–20)
CO2: 24 mmol/L (ref 22–32)
CREATININE: 0.74 mg/dL (ref 0.44–1.00)
Calcium: 9.5 mg/dL (ref 8.9–10.3)
Chloride: 103 mmol/L (ref 101–111)
GFR calc Af Amer: >60 mL/min (ref >60)
GLUCOSE: 86 mg/dL (ref 65–99)
POTASSIUM: 3.7 mmol/L (ref 3.5–5.1)
Sodium: 137 mmol/L (ref 135–145)
Total Bilirubin: 0.5 mg/dL (ref 0.3–1.2)
Total Protein: 8.1 g/dL (ref 6.5–8.1)

## 2017-11-09 NOTE — ED Provider Notes (Signed)
Patient placed in Quick Look pathway, seen and evaluated   Chief Complaint: lower abd pain x2 days  HPI:   Pt is a 22y/o female with a PMHx of UTIs and ovarian cysts, presenting with c/o 2 days of bilateral lower abdominal pain with associated nausea and some stinging dysuria.  She states that she had a similar pain several months ago and she was told that she had a cyst on her ovary.  Chart review reveals that on 07/25/17 she was seen in the ER and had a CT renal study done that showed a possible right ovarian dermoid cyst, she did not have repeat imaging or pelvic U/S after that. [facility] Feb 2019 [facility] and dysmenorrhea, and had negative STD testing done (wet prep showed some clue cells but otherwise was also unremarkable). She was advised to use ibuprofen, no rx's were given, however they mentioned to her that she may need outpatient pelvic U/S to f/up the ?dermoid cyst seen on her ovary on the prior CT. She denies having any fevers, chills, vomiting, diarrhea, constipation, obstipation, melena, hematochezia, hematuria, urinary frequency, vaginal bleeding/discharge, or any other complaints. LMP was 4/5-11/19 but she states her menses are irregular.   ROS: +lower abd pain, +nausea, +dysuria. No fevers, chills, vomiting, diarrhea, constipation, obstipation, melena, hematochezia, hematuria, urinary frequency, vaginal bleeding/discharge  Physical Exam:  BP 130/80 (BP Location: Right Arm)   Pulse 61   Temp 98.1 F (36.7 C) (Oral)   Resp 16   LMP 11/02/2017 (Approximate)   SpO2 100%    Gen: No distress  Neuro: Awake and Alert  Skin: Warm    Focused Exam: abdomen soft, nondistended, +BS throughout, with mild lower abd TTP across the pelvic brim, no r/g/r, neg murphy's, neg mcburney's, no CVA TTP    PRIOR IMAGING: 07/25/17 CT Renal: IMPRESSION: 1. No evidence of hydronephrosis.  Right renal cyst noted. 2. 2.2 cm hyperattenuating focus at the right  ovary may reflect a small dermoid cyst. Pelvic ultrasound could be considered for further evaluation, particularly if the patient's symptoms persist.  Electronically Signed   By: Roanna RaiderJeffery  Chang M.D.   On: 07/25/2017 02:01   Initiation of care has begun. The patient has been counseled on the process, plan, and necessity for staying for the completion/evaluation, and the remainder of the medical screening examination     [address], [place], [place] 11/09/17 1836    Gerhard MunchLockwood, Robert, MD 11/11/17 [phone]

## 2017-11-09 NOTE — ED Triage Notes (Signed)
Pt presents for evaluation of lower abd pain bilaterally x 3-4 days. Pt reports has had intermittent migraines x 1 week.

## 2017-11-10 LAB — GC/CHLAMYDIA PROBE AMP (~~LOC~~) NOT AT ARMC
Chlamydia: NEGATIVE
NEISSERIA GONORRHEA: NEGATIVE

## 2017-11-10 LAB — WET PREP, GENITAL
Sperm: NONE SEEN
TRICH WET PREP: NONE SEEN
Yeast Wet Prep HPF POC: NONE SEEN

## 2017-11-10 LAB — RPR: RPR Ser Ql: NONREACTIVE

## 2017-11-10 LAB — HIV ANTIBODY (ROUTINE TESTING W REFLEX): HIV SCREEN 4TH GENERATION: NONREACTIVE

## 2017-11-10 MED ORDER — METRONIDAZOLE 0.75 % VA GEL: 1.0000 | Freq: Two times a day (BID) | VAGINAL | 0 refills | Status: DC

## 2017-11-10 MED ORDER — DEXAMETHASONE SODIUM PHOSPHATE 10 MG/ML IJ SOLN
10.0000 mg | Freq: Once | INTRAMUSCULAR | Status: DC
  Filled 2017-11-10: qty 1

## 2017-11-10 NOTE — ED Provider Notes (Signed)
[facility] EMERGENCY DEPARTMENT Provider Note   CSN: [id number] Arrival date & time: 11/09/17  1818     History   Chief Complaint Chief Complaint  Patient presents with  . Abdominal Pain    HPI Laura Mendoza is a 22 y.o. female.  Patient with complaint of lower abdominal pain for the past 2-3 days. No fever or vomiting but she endorses nausea. She has a vaginal discharge that she does not feel is significantly abnormal for her, and dysuria described as 'stinging'. She reports normal bowel movements, last one yesterday. She is also having a right sided headache behind her eye that has been there for one week. She denies symptoms of congestion, sore throat, sneezing, cough. Light makes the headache worse. No history of diagnosed migraines. She has taken ibuprofen for headaches with limited relief. No visual changes.   The history is provided by the patient. No language interpreter was used.    Past Medical History:  Diagnosis Date  . Anxiety   . Chlamydia 2016  . UTI (urinary tract infection)   . Vaginal yeast infection     Patient Active Problem List   Diagnosis Date Noted  . 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 NO PAST SURGERIES       OB History   None      Home Medications    Prior to Admission medications   Medication Sig Start Date End Date Taking? Authorizing Provider  ibuprofen (ADVIL,MOTRIN) 200 MG tablet Take 200 mg by mouth every 6 (six) hours as needed for headache.   Yes [provider]    Family History Family History  Problem Relation Age of Onset  . Hypertension Mother   . Irregular heart beat Mother   . Kidney Stones Father   . Hypertension Maternal Grandmother   . Kidney disease Maternal Grandmother   . Diabetes Maternal Grandmother   . Heart failure Maternal Grandmother   . Colon cancer Neg Hx   . Colon polyps Neg Hx   .  Esophageal cancer Neg Hx   . Rectal cancer Neg Hx   . Stomach cancer Neg Hx   . Neuromuscular disorder Neg Hx     Social History Social History   Tobacco Use  . Smoking status: Never Smoker  . Smokeless tobacco: Never Used  Substance Use Topics  . Alcohol use: No  . Drug use: No     Allergies   Patient has no known allergies.   Review of Systems Review of Systems  Constitutional: Negative for chills and fever.  HENT: Negative.  Negative for congestion, ear pain, sinus pressure and sore throat.   Eyes: Positive for photophobia. Negative for visual disturbance.  Respiratory: Negative.  Negative for cough and shortness of breath.   Cardiovascular: Negative.  Negative for chest pain.  Gastrointestinal: Positive for abdominal pain and nausea. Negative for constipation, diarrhea and vomiting.  Genitourinary: Positive for dysuria. Negative for flank pain, menstrual problem, vaginal bleeding and vaginal pain.  Musculoskeletal: Negative.  Negative for back pain.  Skin: Negative.   Neurological: Negative.      Physical Exam Updated Vital Signs BP 112/62   Pulse 62   Temp 98.1 F (36.7 C) (Oral)   Resp 20   LMP 11/02/2017 (Approximate)   SpO2 100%   Physical Exam  Constitutional: She is oriented to person, place, and time. She appears well-developed and well-nourished.  HENT:  Head: Normocephalic.  Nose: Right sinus exhibits frontal sinus tenderness.  Neck: Normal range of motion. Neck supple.  Cardiovascular: Normal rate and regular rhythm.  Pulmonary/Chest: Effort normal and breath sounds normal. She has no wheezes. She has no rales.  Abdominal: Soft. Bowel sounds are normal. There is tenderness. There is no rebound and no guarding.    Genitourinary: Vagina normal.  Genitourinary Comments: Scant white discharge in vaginal vault. No cervical discharge, motion tenderness or adnexal mass/tenderness.   Musculoskeletal: Normal range of motion.  Neurological: She is  alert and oriented to person, place, and time. No sensory deficit.  CN's 3-12 grossly intact. Speech is clear and focused. No facial asymmetry. No lateralizing weakness. Reflexes are equal. No deficits of coordination. Ambulatory without imbalance.    Skin: Skin is warm and dry. No rash noted.  Psychiatric: She has a normal mood and affect.     ED Treatments / Results  Labs (all labs ordered are listed, but only abnormal results are displayed) Labs Reviewed  COMPREHENSIVE METABOLIC PANEL - Abnormal; Notable for the following components:      Result Value   ALT 12 (*)    All other components within normal limits  URINALYSIS, ROUTINE W REFLEX MICROSCOPIC - Abnormal; Notable for the following components:   Hgb urine dipstick SMALL (*)    Squamous Epithelial / LPF 0-5 (*)    All other components within normal limits  WET PREP, GENITAL  CBC WITH DIFFERENTIAL/PLATELET  RPR  HIV ANTIBODY (ROUTINE TESTING)  I-STAT BETA HCG BLOOD, ED ([facility], [facility], [facility] ONLY)  GC/CHLAMYDIA PROBE AMP ([place]) NOT AT [facility]   Results for orders placed or performed during the hospital encounter of 11/09/17  Wet prep, genital  Result Value Ref Range   Yeast Wet Prep HPF POC NONE SEEN NONE SEEN   Trich, Wet Prep NONE SEEN NONE SEEN   Clue Cells Wet Prep HPF POC PRESENT (A) NONE SEEN   WBC, Wet Prep HPF POC FEW (A) NONE SEEN   Sperm NONE SEEN   CBC with Differential  Result Value Ref Range   WBC 6.7 4.0 - 10.5 K/uL   RBC 4.45 3.87 - 5.11 MIL/uL   Hemoglobin 13.9 12.0 - 15.0 g/dL   HCT 16.141.8 09.636.0 - 04.546.0 %   MCV 93.9 78.0 - 100.0 fL   MCH 31.2 26.0 - 34.0 pg   MCHC 33.3 30.0 - 36.0 g/dL   RDW 40.912.7 81.111.5 - 91.415.5 %   Platelets 277 150 - 400 K/uL   Neutrophils Relative % 45 %   Neutro Abs 3.0 1.7 - 7.7 K/uL   Lymphocytes Relative 48 %   Lymphs Abs 3.2 0.7 - 4.0 K/uL   Monocytes Relative 6 %   Monocytes Absolute 0.4 0.1 - 1.0 K/uL   Eosinophils Relative 1 %   Eosinophils Absolute 0.1 0.0 - 0.7 K/uL    Basophils Relative 0 %   Basophils Absolute 0.0 0.0 - 0.1 K/uL  Comprehensive metabolic panel  Result Value Ref Range   Sodium 137 135 - 145 mmol/L   Potassium 3.7 3.5 - 5.1 mmol/L   Chloride 103 101 - 111 mmol/L   CO2 24 22 - 32 mmol/L   Glucose, Bld 86 65 - 99 mg/dL   BUN 8 6 - 20 mg/dL   Creatinine, Ser 7.820.74 0.44 - 1.00 mg/dL   Calcium 9.5 8.9 - 95.610.3 mg/dL   Total Protein 8.1 6.5 - 8.1 g/dL   Albumin 4.0 3.5 - 5.0 g/dL  AST 18 15 - 41 U/L   ALT 12 (L) 14 - 54 U/L   Alkaline Phosphatase 58 38 - 126 U/L   Total Bilirubin 0.5 0.3 - 1.2 mg/dL   GFR calc non Af Amer >60 >60 mL/min   GFR calc Af Amer >60 >60 mL/min   Anion gap 10 5 - 15  Urinalysis, Routine w reflex microscopic  Result Value Ref Range   Color, Urine YELLOW YELLOW   APPearance CLEAR CLEAR   Specific Gravity, Urine 1.018 1.005 - 1.030   pH 5.0 5.0 - 8.0   Glucose, UA NEGATIVE NEGATIVE mg/dL   Hgb urine dipstick SMALL (A) NEGATIVE   Bilirubin Urine NEGATIVE NEGATIVE   Ketones, ur NEGATIVE NEGATIVE mg/dL   Protein, ur NEGATIVE NEGATIVE mg/dL   Nitrite NEGATIVE NEGATIVE   Leukocytes, UA NEGATIVE NEGATIVE   RBC / HPF 0-5 0 - 5 RBC/hpf   WBC, UA 0-5 0 - 5 WBC/hpf   Bacteria, UA NONE SEEN NONE SEEN   Squamous Epithelial / LPF 0-5 (A) NONE SEEN   Mucus PRESENT   I-Stat beta hCG blood, ED ([facility], [facility], [facility] only)  Result Value Ref Range   I-stat hCG, quantitative <5.0 <5 mIU/mL   Comment 3            EKG None  Radiology No results found.  Procedures Procedures (including critical care time)  Medications Ordered in ED Medications - No data to display   Initial Impression / Assessment and Plan / ED Course  I have reviewed the triage vital signs and the nursing notes.  Pertinent labs & imaging results that were available during my care of the patient were reviewed by me and considered in my medical decision making (see chart for details).     Patient presents with c/o lower abdominal pain, and of right  sided headache.   Normal neurologic exam without deficit. There is reproducible tenderness to right ethmoid sinus suggesting sinus related headache. In the absence of any concerning neurologic exam finding, will treat for sinus headache and suggest PCP follow up. Will make referrals.   Lower abdominal pain x 2 days. Labs reassuring. VSS, afebrile, no h/o fever. UA negative for infection. There are some clue cells on wet prep but doubt this is the cause of her pain.   Chart review shows ?dermoid ovarian cyst found on previous CT. Will recommend GYN follow up for further evaluation. Do not feel there is any acute intra-abdominal process.   Final Clinical Impressions(s) / ED Diagnoses   Final diagnoses:  None   1. Abdominal pain 2. BV, mild 3. Sinus headache  ED Discharge Orders    None       Elpidio Anis, PA-C 11/10/17 0044    Wynetta Fines, MD 11/10/17 1258

## 2017-11-10 NOTE — Discharge Instructions (Signed)
Your headache is felt to be related to sinus inflammation. The medication your were given in the ED is in treatment of this. Continue ibuprofen and recommend Zyrtec or Claritin used regularly. Follow up with PCP of your choice for further management. Use the Metrogel for bacterial vaginal infection. You have been given a referral for gynecologic care if your symptoms of lower abdominal pain continue.

## 2018-02-05 ENCOUNTER — Encounter (HOSPITAL_COMMUNITY): Payer: Self-pay | Admitting: Emergency Medicine

## 2018-02-05 ENCOUNTER — Emergency Department (HOSPITAL_COMMUNITY)
Admission: EM | Admit: 2018-02-05 | Discharge: 2018-02-06 | Disposition: A | Discharge status: Home / Self Care | Payer: Medicaid Other | Attending: Emergency Medicine | Admitting: Emergency Medicine

## 2018-02-05 ENCOUNTER — Other Ambulatory Visit: Payer: Self-pay

## 2018-02-05 DIAGNOSIS — R55 Syncope and collapse: Secondary | ICD-10-CM | POA: Insufficient documentation

## 2018-02-05 DIAGNOSIS — N946 Dysmenorrhea, unspecified: Secondary | ICD-10-CM | POA: Insufficient documentation

## 2018-02-05 NOTE — ED Triage Notes (Signed)
Pt states last PM felt very lightheaded and dizzy had near syncopal episode felt that it was related to toxic shock as she just came on her cycle and is newly using tampons normally uses pads.

## 2018-02-06 ENCOUNTER — Other Ambulatory Visit: Payer: Self-pay

## 2018-02-06 LAB — CBC WITH DIFFERENTIAL/PLATELET
Basophils Absolute: 0 10*3/uL (ref 0.0–0.1)
Basophils Relative: 0 %
EOS PCT: 2 %
Eosinophils Absolute: 0.1 10*3/uL (ref 0.0–0.7)
HEMATOCRIT: 39.6 % (ref 36.0–46.0)
Hemoglobin: 13.2 g/dL (ref 12.0–15.0)
LYMPHS ABS: 3 10*3/uL (ref 0.7–4.0)
LYMPHS PCT: 45 %
MCH: 30.5 pg (ref 26.0–34.0)
MCHC: 33.3 g/dL (ref 30.0–36.0)
MCV: 91.5 fL (ref 78.0–100.0)
Monocytes Absolute: 0.6 10*3/uL (ref 0.1–1.0)
Monocytes Relative: 9 %
NEUTROS ABS: 2.9 10*3/uL (ref 1.7–7.7)
Neutrophils Relative %: 44 %
PLATELETS: 252 10*3/uL (ref 150–400)
RBC: 4.33 MIL/uL (ref 3.87–5.11)
RDW: 13.7 % (ref 11.5–15.5)
WBC: 6.6 10*3/uL (ref 4.0–10.5)

## 2018-02-06 LAB — BASIC METABOLIC PANEL
Anion gap: 9 (ref 5–15)
BUN: 9 mg/dL (ref 6–20)
CHLORIDE: 108 mmol/L (ref 98–111)
CO2: 23 mmol/L (ref 22–32)
Calcium: 9.2 mg/dL (ref 8.9–10.3)
Creatinine, Ser: 0.67 mg/dL (ref 0.44–1.00)
GFR calc Af Amer: >60 mL/min (ref >60)
GLUCOSE: 93 mg/dL (ref 70–99)
POTASSIUM: 3.4 mmol/L — AB (ref 3.5–5.1)
Sodium: 140 mmol/L (ref 135–145)

## 2018-02-06 LAB — I-STAT BETA HCG BLOOD, ED (MC, WL, AP ONLY): I-stat hCG, quantitative: <5 m[IU]/mL (ref <5)

## 2018-02-06 NOTE — ED Notes (Addendum)
Upon further discussion with the patient, patient states that when she attempted to insert the tampon, she did not insert is correctly and it became painful at which time she got anxious. Patient states she felt her heart racing and felt light headed. Patient states symptoms went away after removing the tampon. Patient states tampon was only inserted for 1 hour or less. Patient states she feels cramping and is bleeding more than usual.

## 2018-02-06 NOTE — ED Provider Notes (Signed)
[facility]-EMERGENCY DEPT Provider Note   CSN: [id number] Arrival date & time: 02/05/18  2254     History   Chief Complaint Chief Complaint  Patient presents with  . Near Syncope    HPI Laura Mendoza is a 22 y.o. female.  Patient presents to the emergency department with a chief complaint of menstrual cramps.  She states that last night she felt lightheaded and crampy.  She states that she had been using a tampon yesterday and had read online about toxic shock syndrome.  She would like to be evaluated for this.  She has taken tampon out.  She denies feeling lightheaded or dizzy now, but still reports some lower abdominal cramps.  She states that the bleeding has decreased.  She states that last night she also felt slightly nauseated as well as was seeing green lines in her peripheral vision while looking at her phone in a dark room.  She denies any of these symptoms now.  The history is provided by the patient. No language interpreter was used.    Past Medical History:  Diagnosis Date  . Anxiety   . Chlamydia 2016  . UTI (urinary tract infection)   . Vaginal yeast infection     Patient Active Problem List   Diagnosis Date Noted  . 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 NO PAST SURGERIES       OB History   None      Home Medications    Prior to Admission medications   Medication Sig Start Date End Date Taking? Authorizing Provider  ibuprofen (ADVIL,MOTRIN) 200 MG tablet Take 200 mg by mouth every 6 (six) hours as needed for headache.    [provider]  metroNIDAZOLE (METROGEL VAGINAL) 0.75 % vaginal gel Place 1 Applicatorful vaginally 2 (two) times daily. 11/10/17   Elpidio AnisUpstill, Shari, PA-C    Family History Family History  Problem Relation Age of Onset  . Hypertension Mother   . Irregular heart beat Mother   . Kidney Stones Father   . Hypertension  Maternal Grandmother   . Kidney disease Maternal Grandmother   . Diabetes Maternal Grandmother   . Heart failure Maternal Grandmother   . Colon cancer Neg Hx   . Colon polyps Neg Hx   . Esophageal cancer Neg Hx   . Rectal cancer Neg Hx   . Stomach cancer Neg Hx   . Neuromuscular disorder Neg Hx     Social History Social History   Tobacco Use  . Smoking status: Never Smoker  . Smokeless tobacco: Never Used  Substance Use Topics  . Alcohol use: No  . Drug use: No     Allergies   Patient has no known allergies.   Review of Systems Review of Systems  All other systems reviewed and are negative.    Physical Exam Updated Vital Signs BP 119/83 (BP Location: Left Arm)   Pulse 65   Temp 98.4 F (36.9 C) (Oral)   Resp 16   LMP 02/05/2018   SpO2 98%   Physical Exam  Constitutional: She is oriented to person, place, and time. She appears well-developed and well-nourished.  HENT:  Head: Normocephalic and atraumatic.  Eyes: Pupils are equal, round, and reactive to light. Conjunctivae and EOM are normal.  Neck: Normal range of motion. Neck supple.  Cardiovascular: Normal rate and regular rhythm. Exam reveals no gallop and no friction rub.  No murmur heard. Pulmonary/Chest: Effort normal and breath sounds normal. No respiratory distress. She has no wheezes. She has no rales. She exhibits no tenderness.  Abdominal: Soft. Bowel sounds are normal. She exhibits no distension and no mass. There is no tenderness. There is no rebound and no guarding.  Abdomen is soft nontender  Musculoskeletal: Normal range of motion. She exhibits no edema or tenderness.  Neurological: She is alert and oriented to person, place, and time.  Skin: Skin is warm and dry.  Psychiatric: She has a normal mood and affect. Her behavior is normal. Judgment and thought content normal.  Nursing note and vitals reviewed.    ED Treatments / Results  Labs (all labs ordered are listed, but only abnormal  results are displayed) Labs Reviewed - No data to display  EKG None  Radiology No results found.  Procedures Procedures (including critical care time)  Medications Ordered in ED Medications - No data to display   Initial Impression / Assessment and Plan / ED Course  I have reviewed the triage vital signs and the nursing notes.  Pertinent labs & imaging results that were available during my care of the patient were reviewed by me and considered in my medical decision making (see chart for details).     Patient with menstrual cramps.  States that last night she felt lightheaded had some visual possible hallucination of seeing green lines in her peripheral vision while looking at her phone.  I am uncertain what to make this.  She is not having symptoms now.  She responds to all questions appropriately.  She has normal vision now.  Will recommend PCP follow-up.  Final Clinical Impressions(s) / ED Diagnoses   Final diagnoses:  Near syncope    ED Discharge Orders    None       Roxy Horseman, PA-C 02/06/18 0247    Molpus, Jonny Ruiz, MD 02/06/18 [phone]

## 2018-08-14 ENCOUNTER — Emergency Department (HOSPITAL_COMMUNITY)
Admission: EM | Admit: 2018-08-14 | Discharge: 2018-08-14 | Disposition: A | Discharge status: Home / Self Care | Payer: Medicaid Other | Attending: Emergency Medicine | Admitting: Emergency Medicine

## 2018-08-14 ENCOUNTER — Emergency Department (HOSPITAL_COMMUNITY): Payer: Medicaid Other

## 2018-08-14 ENCOUNTER — Other Ambulatory Visit: Payer: Self-pay

## 2018-08-14 ENCOUNTER — Encounter (HOSPITAL_COMMUNITY): Payer: Self-pay

## 2018-08-14 DIAGNOSIS — R059 Cough, unspecified: Secondary | ICD-10-CM

## 2018-08-14 DIAGNOSIS — R05 Cough: Secondary | ICD-10-CM | POA: Insufficient documentation

## 2018-08-14 DIAGNOSIS — Z79899 Other long term (current) drug therapy: Secondary | ICD-10-CM | POA: Insufficient documentation

## 2018-08-14 MED ORDER — BENZONATATE 100 MG PO CAPS: 100.0000 mg | ORAL_CAPSULE | Freq: Three times a day (TID) | ORAL | 0 refills | Status: DC

## 2018-08-14 MED ORDER — FLUTICASONE PROPIONATE 50 MCG/ACT NA SUSP: 1.0000 | Freq: Every day | NASAL | 0 refills | Status: DC

## 2018-08-14 MED ORDER — NAPROXEN 500 MG PO TABS: 500.0000 mg | ORAL_TABLET | Freq: Two times a day (BID) | ORAL | 0 refills | Status: DC

## 2018-08-14 MED ORDER — METHOCARBAMOL 500 MG PO TABS: 500.0000 mg | ORAL_TABLET | Freq: Three times a day (TID) | ORAL | 0 refills | Status: DC | PRN

## 2018-08-14 NOTE — ED Provider Notes (Signed)
[facility]-EMERGENCY DEPT Provider Note   CSN: [id number] Arrival date & time: 08/14/18  2125     History   Chief Complaint Chief Complaint  Patient presents with  . Cough    HPI Laura Mendoza is a 23 y.o. female with a hx of anxiety who presents to the ED with her mother with complaints of cough x 1 week. Patient states cough seems to be progressively worsening and is intermittently productive of mucous sputum. She states over the past 24 hours she has developed some chest discomfort mostly with coughing otherwise her chest feels sore. Pain is also worse with movement. She has noted some nasal congestion. Tried OTC cough syrup & drops without much change. Denies fever, chills, sore throat, ear pain, dyspnea, vomiting, abdominal pain, or diarrhea.    HPI  Past Medical History:  Diagnosis Date  . Anxiety   . Chlamydia 2016  . UTI (urinary tract infection)   . Vaginal yeast infection     Patient Active Problem List   Diagnosis Date Noted  . 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 NO PAST SURGERIES       OB History   No obstetric history on file.      Home Medications    Prior to Admission medications   Medication Sig Start Date End Date Taking? Authorizing Provider  benzonatate (TESSALON) 100 MG capsule Take 1 capsule (100 mg total) by mouth every 8 (eight) hours. 08/14/18   Tameya Kuznia R, PA-C  fluticasone (FLONASE) 50 MCG/ACT nasal spray Place 1 spray into both nostrils daily. 08/14/18   Yaniris Braddock R, PA-C  ibuprofen (ADVIL,MOTRIN) 200 MG tablet Take 200 mg by mouth every 6 (six) hours as needed for headache.    [provider]  methocarbamol (ROBAXIN) 500 MG tablet Take 1 tablet (500 mg total) by mouth every 8 (eight) hours as needed for muscle spasms. 08/14/18   Aidric Endicott R, PA-C  metroNIDAZOLE (METROGEL VAGINAL) 0.75 % vaginal  gel Place 1 Applicatorful vaginally 2 (two) times daily. 11/10/17   Elpidio Anis, PA-C  naproxen (NAPROSYN) 500 MG tablet Take 1 tablet (500 mg total) by mouth 2 (two) times daily. 08/14/18   Sissy Goetzke, Pleas Koch, PA-C    Family History Family History  Problem Relation Age of Onset  . Hypertension Mother   . Irregular heart beat Mother   . Kidney Stones Father   . Hypertension Maternal Grandmother   . Kidney disease Maternal Grandmother   . Diabetes Maternal Grandmother   . Heart failure Maternal Grandmother   . Colon cancer Neg Hx   . Colon polyps Neg Hx   . Esophageal cancer Neg Hx   . Rectal cancer Neg Hx   . Stomach cancer Neg Hx   . Neuromuscular disorder Neg Hx     Social History Social History   Tobacco Use  . Smoking status: Never Smoker  . Smokeless tobacco: Never Used  Substance Use Topics  . Alcohol use: No  . Drug use: No     Allergies   Patient has no known allergies.   Review of Systems Review of Systems  Constitutional: Negative for chills and fever.  HENT: Positive for congestion. Negative for ear pain and sore throat.   Respiratory: Positive for cough. Negative for shortness of breath.   Cardiovascular: Positive for chest pain. Negative for palpitations and leg swelling.  Gastrointestinal: Negative for abdominal pain,  diarrhea, nausea and vomiting.  Neurological: Negative for syncope.   Physical Exam Updated Vital Signs BP (!) 109/52 (BP Location: Left Arm)   Pulse 82   Temp 98.6 F (37 C) (Oral)   Resp 18   Ht 5\' 4"  (1.626 m)   Wt 49 kg   LMP 07/26/2018 (Exact Date)   SpO2 100%   BMI 18.54 kg/m   Physical Exam Vitals signs and nursing note reviewed.  Constitutional:      General: She is not in acute distress.    Appearance: She is well-developed.  HENT:     Head: Normocephalic and atraumatic.     Right Ear: Tympanic membrane, ear canal and external ear normal. Tympanic membrane is not perforated, erythematous, retracted or  bulging.     Left Ear: Tympanic membrane, ear canal and external ear normal. Tympanic membrane is not perforated, erythematous, retracted or bulging.     Nose: Mucosal edema and congestion present.     Right Sinus: No maxillary sinus tenderness or frontal sinus tenderness.     Left Sinus: No maxillary sinus tenderness or frontal sinus tenderness.     Comments: NO sinus tenderness.     Mouth/Throat:     Mouth: Mucous membranes are moist.     Pharynx: Uvula midline. No oropharyngeal exudate or posterior oropharyngeal erythema.  Eyes:     General:        Right eye: No discharge.        Left eye: No discharge.     Conjunctiva/sclera: Conjunctivae normal.     Pupils: Pupils are equal, round, and reactive to light.  Neck:     Musculoskeletal: Normal range of motion and neck supple. No edema or neck rigidity.  Cardiovascular:     Rate and Rhythm: Normal rate and regular rhythm.     Heart sounds: No murmur.  Pulmonary:     Effort: No respiratory distress.     Breath sounds: Normal breath sounds. No wheezing, rhonchi or rales.  Chest:     Chest wall: Tenderness (to lower anterior chest wall without overlying skin changes or palpable crepitus/deformity) present.  Abdominal:     General: There is no distension.     Palpations: Abdomen is soft.     Tenderness: There is no abdominal tenderness.  Musculoskeletal:     Right lower leg: No edema.     Left lower leg: No edema.  Lymphadenopathy:     Cervical: No cervical adenopathy.  Skin:    General: Skin is warm and dry.     Findings: No rash.  Neurological:     Mental Status: She is alert.  Psychiatric:        Behavior: Behavior normal.     ED Treatments / Results  Labs (all labs ordered are listed, but only abnormal results are displayed) Labs Reviewed - No data to display  EKG None  Radiology No results found.  Procedures Procedures (including critical care time)  Medications Ordered in ED Medications - No data to  display   Initial Impression / Assessment and Plan / ED Course  I have reviewed the triage vital signs and the nursing notes.  Pertinent labs & imaging results that were available during my care of the patient were reviewed by me and considered in my medical decision making (see chart for details).   Patient presents to the ED with complaint of cough. Nontoxic appearing in no apparent distress, vitals without significant abnormality. Patient w/ some nasal congestion noted, afebrile,  no sinus tenderness- doubt acute bacterial sinusitis. Centor 0- doubt strep. TMs clear. No meningismus. Lungs CTA, afebrile, CXR negative for infiltrate- doubt pneumonia. CXR also negative for PTX, edema, effusion, or rib fracture/dislocation. Suspect cough is viral, chest discomfort is reproducible w/ chest wall palpation- likely muscular in nature, doubt ACS/PE. Discharge home with supportive measures & PCP Follow up. I discussed results, treatment plan, need for follow-up, and return precautions with the patient. Provided opportunity for questions, patient confirmed understanding and is in agreement with plan.    Final Clinical Impressions(s) / ED Diagnoses   Final diagnoses:  Cough    ED Discharge Orders         Ordered    fluticasone (FLONASE) 50 MCG/ACT nasal spray  Daily     08/14/18 2246    benzonatate (TESSALON) 100 MG capsule  Every 8 hours     08/14/18 2246    naproxen (NAPROSYN) 500 MG tablet  2 times daily     08/14/18 2246    methocarbamol (ROBAXIN) 500 MG tablet  Every 8 hours PRN     08/14/18 2246           Cherly Andersonetrucelli, Uri Turnbough R, PA-C 08/14/18 2306    Maia PlanLong, Joshua G, MD 08/14/18 [phone]

## 2018-08-14 NOTE — Discharge Instructions (Signed)
You were seen in the emergency today for a cough and chest pain. Your chest xray was normal. We suspect your cough is due to a virus or allergies and that your chest pain is due to chest wall irritation from coughing. We have prescribed you multiple medications to treat your symptoms.   -Flonase to be used 1 spray in each nostril daily.  This medication is used to treat your congestion.  -Tessalon can be taken once every 8 hours as needed.  This medication is used to treat your cough.  - Naproxen is a nonsteroidal anti-inflammatory medication that will help with pain and swelling. Be sure to take this medication as prescribed with food, 1 pill every 12 hours,  It should be taken with food, as it can cause stomach upset, and more seriously, stomach bleeding. Do not take other nonsteroidal anti-inflammatory medications with this such as Advil, Motrin, Aleve, Mobic, Goodie Powder, or Motrin.    - Robaxin is the muscle relaxer I have prescribed, this is meant to help with muscle tightness. Be aware that this medication may make you drowsy therefore the first time you take this it should be at a time you are in an environment where you can rest. Do not drive or operate heavy machinery when taking this medication. Do not drink alcohol or take other sedating medications with this medicine such as narcotics or benzodiazepines.   You make take Tylenol per over the counter dosing with these medications.   We have prescribed you new medication(s) today. Discuss the medications prescribed today with your pharmacist as they can have adverse effects and interactions with your other medicines including over the counter and prescribed medications. Seek medical evaluation if you start to experience new or abnormal symptoms after taking one of these medicines, seek care immediately if you start to experience difficulty breathing, feeling of your throat closing, facial swelling, or rash as these could be indications of a  more serious allergic reaction  You will need to follow-up with your primary care provider in 1 week if your symptoms have not improved.  If you do not have a primary care provider one is provided in your discharge instructions.  Return to the emergency department for any new or worsening symptoms including but not limited to persistent fever for 5 days, difficulty breathing, chest pain, rashes, passing out, or any other concerns.

## 2018-08-14 NOTE — ED Triage Notes (Signed)
For about one week states has been coughing and now has chest discomfort when she coughs or moves no other complaints voiced.

## 2018-08-14 NOTE — ED Notes (Signed)
Bed: WTR7 Expected date:  Expected time:  Means of arrival:  Comments: 

## 2018-09-23 ENCOUNTER — Encounter (HOSPITAL_COMMUNITY): Payer: Self-pay | Admitting: *Deleted

## 2018-09-23 ENCOUNTER — Ambulatory Visit (HOSPITAL_COMMUNITY)
Admission: EM | Admit: 2018-09-23 | Discharge: 2018-09-23 | Disposition: A | Discharge status: Home / Self Care | Payer: Medicaid Other | Attending: Physician Assistant | Admitting: Physician Assistant

## 2018-09-23 ENCOUNTER — Other Ambulatory Visit: Payer: Self-pay

## 2018-09-23 DIAGNOSIS — H02843 Edema of right eye, unspecified eyelid: Secondary | ICD-10-CM

## 2018-09-23 DIAGNOSIS — H00011 Hordeolum externum right upper eyelid: Secondary | ICD-10-CM

## 2018-09-23 MED ORDER — TETRACAINE HCL 0.5 % OP SOLN
OPHTHALMIC | Status: AC
  Filled 2018-09-23: qty 4

## 2018-09-23 MED ORDER — ERYTHROMYCIN 5 MG/GM OP OINT: TOPICAL_OINTMENT | OPHTHALMIC | 0 refills | Status: DC

## 2018-09-23 MED ORDER — POLYETHYL GLYCOL-PROPYL GLYCOL 0.4-0.3 % OP GEL: 1.0000 "application " | Freq: Every evening | OPHTHALMIC | 0 refills | Status: DC | PRN

## 2018-09-23 NOTE — Discharge Instructions (Addendum)
Start erythromycin as directed. Artificial tear gel at night. Wait 10-15 minutes between drops, always use artificial tear gel last, as it prevents drops from penetrating through. Lid scrubs and warm compresses as directed. Monitor for any worsening of symptoms, changes in vision, sensitivity to light, eye swelling, painful eye movement, follow up with ophthalmology for further evaluation.  ° °

## 2018-09-23 NOTE — ED Provider Notes (Signed)
[facility]    CSN: [id number] Arrival date & time: 09/23/18  9201     History   Chief Complaint Chief Complaint  Patient presents with  . Eye Problem    HPI Laura Mendoza is a 23 y.o. female.   23 year old female comes in for few day history of right upper eyelid pain with worsening swelling for the past 2 days. States has mild photophobia/ eye irritation. Denies eye redness, changes in vision, eye injury. Denies URI symptoms such as cough, congestion, sore throat. Has been applying warm compress with temporary relief. States normally wears contacts, but has not been wearing them for the past 1-2 weeks. Does not have her glasses with her.      Past Medical History:  Diagnosis Date  . Anxiety   . Chlamydia 2016  . UTI (urinary tract infection)   . Vaginal yeast infection     Patient Active Problem List   Diagnosis Date Noted  . 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 NO PAST SURGERIES      OB History   No obstetric history on file.      Home Medications    Prior to Admission medications   Medication Sig Start Date End Date Taking? Authorizing Provider  benzonatate (TESSALON) 100 MG capsule Take 1 capsule (100 mg total) by mouth every 8 (eight) hours. 08/14/18   Petrucelli, Samantha R, PA-C  erythromycin ophthalmic ointment Place a 1/2 inch ribbon of ointment to right upper eyelash 4 times a day for the next 7 days. 09/23/18   Cathie Hoops, Cohl Behrens V, PA-C  fluticasone (FLONASE) 50 MCG/ACT nasal spray Place 1 spray into both nostrils daily. 08/14/18   Petrucelli, Samantha R, PA-C  ibuprofen (ADVIL,MOTRIN) 200 MG tablet Take 200 mg by mouth every 6 (six) hours as needed for headache.    [provider]  methocarbamol (ROBAXIN) 500 MG tablet Take 1 tablet (500 mg total) by mouth every 8 (eight) hours as needed for muscle spasms. 08/14/18   Petrucelli, Samantha R, PA-C  metroNIDAZOLE  (METROGEL VAGINAL) 0.75 % vaginal gel Place 1 Applicatorful vaginally 2 (two) times daily. 11/10/17   Elpidio Anis, PA-C  naproxen (NAPROSYN) 500 MG tablet Take 1 tablet (500 mg total) by mouth 2 (two) times daily. 08/14/18   Petrucelli, Samantha R, PA-C  Polyethyl Glycol-Propyl Glycol (SYSTANE) 0.4-0.3 % GEL ophthalmic gel Place 1 application into both eyes at bedtime as needed. 09/23/18   Belinda Fisher, PA-C    Family History Family History  Problem Relation Age of Onset  . Hypertension Mother   . Irregular heart beat Mother   . Kidney Stones Father   . Hypertension Maternal Grandmother   . Kidney disease Maternal Grandmother   . Diabetes Maternal Grandmother   . Heart failure Maternal Grandmother   . Colon cancer Neg Hx   . Colon polyps Neg Hx   . Esophageal cancer Neg Hx   . Rectal cancer Neg Hx   . Stomach cancer Neg Hx   . Neuromuscular disorder Neg Hx     Social History Social History   Tobacco Use  . Smoking status: Never Smoker  . Smokeless tobacco: Never Used  Substance Use Topics  . Alcohol use: Yes  . Drug use: No     Allergies   Patient has no known allergies.   Review of Systems Review of Systems  Reason unable to perform ROS:  See HPI as above.     Physical Exam Triage Vital Signs ED Triage Vitals  Enc Vitals Group     BP 09/23/18 0957 (!) 125/58     Pulse Rate 09/23/18 0957 64     Resp 09/23/18 0957 14     Temp 09/23/18 0957 98.7 F (37.1 C)     Temp Source 09/23/18 0957 Oral     SpO2 09/23/18 0957 100 %     Weight --      Height --      Head Circumference --      Peak Flow --      Pain Score 09/23/18 0958 10     Pain Loc --      Pain Edu? --      Excl. in GC? --    No data found.  Updated Vital Signs BP (!) 125/58 (BP Location: Right Arm)   Pulse 64   Temp 98.7 F (37.1 C) (Oral)   Resp 14   LMP 08/28/2018 (Exact Date)   SpO2 100%   Visual Acuity (did not bring glasses with her) Right Eye Distance:   20/400 ([place]) Left Eye  Distance:   20/400 ([place]) Bilateral Distance:    Right Eye Near:   Left Eye Near:    Bilateral Near:     Physical Exam Constitutional:      General: She is not in acute distress.    Appearance: She is well-developed. She is not ill-appearing, toxic-appearing or diaphoretic.  HENT:     Head: Normocephalic and atraumatic.  Eyes:     Extraocular Movements: Extraocular movements intact.     Conjunctiva/sclera: Conjunctivae normal.     Pupils: Pupils are equal, round, and reactive to light.     Comments: Generalized swelling of right upper eyelid with erythema. No warmth. Hordeolum felt to the mid upper eyelid. No photophobia on exam.  Fluorescein stain without uptake.  Neurological:     Mental Status: She is alert and oriented to person, place, and time.      UC Treatments / Results  Labs (all labs ordered are listed, but only abnormal results are displayed) Labs Reviewed - No data to display  EKG None  Radiology No results found.  Procedures Procedures (including critical care time)  Medications Ordered in UC Medications - No data to display  Initial Impression / Assessment and Plan / UC Course  I have reviewed the triage vital signs and the nursing notes.  Pertinent labs & imaging results that were available during my care of the patient were reviewed by me and considered in my medical decision making (see chart for details).    Start erythromycin as directed.  Other symptomatic treatment discussed.  Lid scrubs, warm compress.  Return precautions given.  Patient expresses understanding and agrees with plan.  Final Clinical Impressions(s) / UC Diagnoses   Final diagnoses:  Hordeolum externum of right upper eyelid  Swelling of gland of right eyelid    ED Prescriptions    Medication Sig Dispense Auth. Provider   erythromycin ophthalmic ointment Place a 1/2 inch ribbon of ointment to right upper eyelash 4 times a day for the next 7 days. 1 g Kannon Baum V, PA-C    Polyethyl Glycol-Propyl Glycol (SYSTANE) 0.4-0.3 % GEL ophthalmic gel Place 1 application into both eyes at bedtime as needed. 1 Bottle Threasa Alpha, [place] 09/23/18 1107

## 2018-09-23 NOTE — ED Triage Notes (Signed)
C/o pain in right eye states it was swollen , states it was worse today.

## 2018-12-07 ENCOUNTER — Emergency Department (HOSPITAL_COMMUNITY)
Admission: EM | Admit: 2018-12-07 | Discharge: 2018-12-07 | Disposition: A | Discharge status: Home / Self Care | Payer: 59 | Attending: Emergency Medicine | Admitting: Emergency Medicine

## 2018-12-07 ENCOUNTER — Other Ambulatory Visit: Payer: Self-pay

## 2018-12-07 ENCOUNTER — Encounter (HOSPITAL_COMMUNITY): Payer: Self-pay | Admitting: Emergency Medicine

## 2018-12-07 ENCOUNTER — Ambulatory Visit (INDEPENDENT_AMBULATORY_CARE_PROVIDER_SITE_OTHER)
Admission: EM | Admit: 2018-12-07 | Discharge: 2018-12-07 | Disposition: A | Discharge status: Home / Self Care | Payer: 59 | Source: Home / Self Care | Attending: Family Medicine | Admitting: Family Medicine

## 2018-12-07 DIAGNOSIS — R0602 Shortness of breath: Secondary | ICD-10-CM | POA: Diagnosis not present

## 2018-12-07 DIAGNOSIS — R51 Headache: Secondary | ICD-10-CM | POA: Insufficient documentation

## 2018-12-07 DIAGNOSIS — Z202 Contact with and (suspected) exposure to infections with a predominantly sexual mode of transmission: Secondary | ICD-10-CM

## 2018-12-07 DIAGNOSIS — R42 Dizziness and giddiness: Secondary | ICD-10-CM | POA: Diagnosis not present

## 2018-12-07 DIAGNOSIS — R6883 Chills (without fever): Secondary | ICD-10-CM | POA: Diagnosis not present

## 2018-12-07 DIAGNOSIS — Z113 Encounter for screening for infections with a predominantly sexual mode of transmission: Secondary | ICD-10-CM

## 2018-12-07 LAB — BASIC METABOLIC PANEL
Anion gap: 10 (ref 5–15)
BUN: <5 mg/dL — ABNORMAL LOW (ref 6–20)
CO2: 21 mmol/L — ABNORMAL LOW (ref 22–32)
Calcium: 9.2 mg/dL (ref 8.9–10.3)
Chloride: 107 mmol/L (ref 98–111)
Creatinine, Ser: 0.83 mg/dL (ref 0.44–1.00)
GFR calc Af Amer: >60 mL/min (ref >60)
GFR calc non Af Amer: >60 mL/min (ref >60)
Glucose, Bld: 91 mg/dL (ref 70–99)
Potassium: 3.5 mmol/L (ref 3.5–5.1)
Sodium: 138 mmol/L (ref 135–145)

## 2018-12-07 LAB — URINALYSIS, ROUTINE W REFLEX MICROSCOPIC
Bilirubin Urine: NEGATIVE
Glucose, UA: NEGATIVE mg/dL
Ketones, ur: 5 mg/dL — AB
Leukocytes,Ua: NEGATIVE
Nitrite: NEGATIVE
Protein, ur: NEGATIVE mg/dL
Specific Gravity, Urine: 1.009 (ref 1.005–1.030)
pH: 6 (ref 5.0–8.0)

## 2018-12-07 LAB — PREGNANCY, URINE: Preg Test, Ur: NEGATIVE

## 2018-12-07 LAB — CBC WITH DIFFERENTIAL/PLATELET
Abs Immature Granulocytes: 0.01 10*3/uL (ref 0.00–0.07)
Basophils Absolute: 0 10*3/uL (ref 0.0–0.1)
Basophils Relative: 0 %
Eosinophils Absolute: 0 10*3/uL (ref 0.0–0.5)
Eosinophils Relative: 1 %
HCT: 40.7 % (ref 36.0–46.0)
Hemoglobin: 12.9 g/dL (ref 12.0–15.0)
Immature Granulocytes: 0 %
Lymphocytes Relative: 28 %
Lymphs Abs: 1.4 10*3/uL (ref 0.7–4.0)
MCH: 30.1 pg (ref 26.0–34.0)
MCHC: 31.7 g/dL (ref 30.0–36.0)
MCV: 94.9 fL (ref 80.0–100.0)
Monocytes Absolute: 0.4 10*3/uL (ref 0.1–1.0)
Monocytes Relative: 9 %
Neutro Abs: 3.1 10*3/uL (ref 1.7–7.7)
Neutrophils Relative %: 62 %
Platelets: 239 10*3/uL (ref 150–400)
RBC: 4.29 MIL/uL (ref 3.87–5.11)
RDW: 13.6 % (ref 11.5–15.5)
WBC: 4.9 10*3/uL (ref 4.0–10.5)
nRBC: 0 % (ref 0.0–0.2)

## 2018-12-07 MED ORDER — AZITHROMYCIN 250 MG PO TABS
ORAL_TABLET | ORAL | Status: AC
  Filled 2018-12-07: qty 4

## 2018-12-07 MED ORDER — CEFTRIAXONE SODIUM 250 MG IJ SOLR
250.0000 mg | Freq: Once | INTRAMUSCULAR | Status: AC
  Administered 2018-12-07: 250 mg via INTRAMUSCULAR

## 2018-12-07 MED ORDER — SODIUM CHLORIDE 0.9 % IV BOLUS
1000.0000 mL | Freq: Once | INTRAVENOUS | Status: AC
  Administered 2018-12-07: 13:00:00 1000 mL via INTRAVENOUS

## 2018-12-07 MED ORDER — CEFTRIAXONE SODIUM 250 MG IJ SOLR
INTRAMUSCULAR | Status: AC
  Filled 2018-12-07: qty 250

## 2018-12-07 MED ORDER — AZITHROMYCIN 250 MG PO TABS
1000.0000 mg | ORAL_TABLET | Freq: Once | ORAL | Status: AC
  Administered 2018-12-07: 19:00:00 1000 mg via ORAL

## 2018-12-07 MED ORDER — LIDOCAINE HCL (PF) 1 % IJ SOLN
INTRAMUSCULAR | Status: AC
  Filled 2018-12-07: qty 2

## 2018-12-07 NOTE — Discharge Instructions (Signed)

## 2018-12-07 NOTE — ED Provider Notes (Signed)
[facility]   CSN: [id number] Arrival date & time: 12/07/18  1225    History   Chief Complaint Chief Complaint  Patient presents with  . Dizziness  . Shortness of Breath    HPI Laura Mendoza is a 23 y.o. female.  She is presenting to the emergency department complaining of 2 days of intermittent lightheadedness dizziness, shortness of breath, headaches, feeling hot and cold.  Her symptoms improved with some ibuprofen.  She denies any fevers cough runny nose chest pain nausea vomiting diarrhea or urinary symptoms.  Last menstrual period was a few days ago.  No sick contacts or recent travel.     The history is provided by the patient.  Dizziness  Quality:  Lightheadedness Severity:  Moderate Onset quality:  Gradual Duration:  2 days Timing:  Intermittent Progression:  Unchanged Chronicity:  New Context: standing up   Context: not with loss of consciousness   Relieved by:  Nothing Worsened by:  Movement Ineffective treatments:  None tried Associated symptoms: headaches and shortness of breath   Associated symptoms: no chest pain, no diarrhea, no syncope, no vision changes and no vomiting   Shortness of Breath  Associated symptoms: headaches   Associated symptoms: no abdominal pain, no chest pain, no fever, no neck pain, no rash, no sore throat, no syncope and no vomiting     Past Medical History:  Diagnosis Date  . Anxiety   . Chlamydia 2016  . UTI (urinary tract infection)   . Vaginal yeast infection     Patient Active Problem List   Diagnosis Date Noted  . 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 NO PAST SURGERIES       OB History   No obstetric history on file.      Home Medications    Prior to Admission medications   Medication Sig Start Date End Date Taking? Authorizing Provider  benzonatate (TESSALON) 100 MG capsule  Take 1 capsule (100 mg total) by mouth every 8 (eight) hours. 08/14/18   Petrucelli, Samantha R, PA-C  erythromycin ophthalmic ointment Place a 1/2 inch ribbon of ointment to right upper eyelash 4 times a day for the next 7 days. 09/23/18   Cathie HoopsYu, Amy V, PA-C  fluticasone (FLONASE) 50 MCG/ACT nasal spray Place 1 spray into both nostrils daily. 08/14/18   Petrucelli, Samantha R, PA-C  ibuprofen (ADVIL,MOTRIN) 200 MG tablet Take 200 mg by mouth every 6 (six) hours as needed for headache.    [provider]  methocarbamol (ROBAXIN) 500 MG tablet Take 1 tablet (500 mg total) by mouth every 8 (eight) hours as needed for muscle spasms. 08/14/18   Petrucelli, Samantha R, PA-C  metroNIDAZOLE (METROGEL VAGINAL) 0.75 % vaginal gel Place 1 Applicatorful vaginally 2 (two) times daily. 11/10/17   Elpidio AnisUpstill, Shari, PA-C  naproxen (NAPROSYN) 500 MG tablet Take 1 tablet (500 mg total) by mouth 2 (two) times daily. 08/14/18   Petrucelli, Samantha R, PA-C  Polyethyl Glycol-Propyl Glycol (SYSTANE) 0.4-0.3 % GEL ophthalmic gel Place 1 application into both eyes at bedtime as needed. 09/23/18   Belinda FisherYu, Amy V, PA-C    Family History Family History  Problem Relation Age of Onset  . Hypertension Mother   . Irregular heart beat Mother   . Kidney Stones Father   . Hypertension Maternal Grandmother   . Kidney disease Maternal Grandmother   . Diabetes Maternal Grandmother   .  Heart failure Maternal Grandmother   . Colon cancer Neg Hx   . Colon polyps Neg Hx   . Esophageal cancer Neg Hx   . Rectal cancer Neg Hx   . Stomach cancer Neg Hx   . Neuromuscular disorder Neg Hx     Social History Social History   Tobacco Use  . Smoking status: Never Smoker  . Smokeless tobacco: Never Used  Substance Use Topics  . Alcohol use: Yes  . Drug use: No     Allergies   Patient has no known allergies.   Review of Systems Review of Systems  Constitutional: Positive for chills. Negative for fever.  HENT: Negative for sore  throat.   Eyes: Negative for visual disturbance.  Respiratory: Positive for shortness of breath.   Cardiovascular: Negative for chest pain and syncope.  Gastrointestinal: Negative for abdominal pain, diarrhea and vomiting.  Genitourinary: Negative for dysuria.  Musculoskeletal: Negative for neck pain.  Skin: Negative for rash.  Neurological: Positive for dizziness and headaches.     Physical Exam Updated Vital Signs BP 119/77   Pulse 77   Temp 98.2 F (36.8 C) (Oral)   Resp 20   Ht  (1.626 m)   Wt 49 kg   LMP 12/04/2018   SpO2 100%   BMI 18.54 kg/m   Physical Exam Vitals signs and nursing note reviewed.  Constitutional:      General: She is not in acute distress.    Appearance: She is well-developed.  HENT:     Head: Normocephalic and atraumatic.  Eyes:     Conjunctiva/sclera: Conjunctivae normal.  Neck:     Musculoskeletal: Neck supple.  Cardiovascular:     Rate and Rhythm: Normal rate and regular rhythm.     Heart sounds: No murmur.  Pulmonary:     Effort: Pulmonary effort is normal. No respiratory distress.     Breath sounds: Normal breath sounds.  Abdominal:     Palpations: Abdomen is soft.     Tenderness: There is no abdominal tenderness.  Musculoskeletal: Normal range of motion.     Right lower leg: She exhibits no tenderness.     Left lower leg: She exhibits no tenderness.  Skin:    General: Skin is warm and dry.     Capillary Refill: Capillary refill takes less than 2 seconds.  Neurological:     General: No focal deficit present.     Mental Status: She is alert and oriented to person, place, and time.     Motor: No weakness.      ED Treatments / Results  Labs (all labs ordered are listed, but only abnormal results are displayed) Labs Reviewed  BASIC METABOLIC PANEL - Abnormal; Notable for the following components:      Result Value   CO2 21 (*)    BUN <5 (*)    All other components within normal limits  URINALYSIS, ROUTINE W REFLEX  MICROSCOPIC - Abnormal; Notable for the following components:   APPearance HAZY (*)    Hgb urine dipstick LARGE (*)    Ketones, ur 5 (*)    Bacteria, UA RARE (*)    All other components within normal limits  CBC WITH DIFFERENTIAL/PLATELET  PREGNANCY, URINE    EKG EKG Interpretation  Date/Time:  Wednesday Dec 07 2018 12:46:14 EDT Ventricular Rate:  73 PR Interval:    QRS Duration: 77 QT Interval:  372 QTC Calculation: 410 R Axis:   87 Text Interpretation:  Sinus rhythm Lead(s)  I were not used for morphology analysis similar to prior 10/16 Confirmed by Meridee Score [phone]) on 12/07/2018 12:52:40 PM Also confirmed by Meridee Score [phone]), editor Barbette Hair [phone])  on 12/07/2018 3:55:17 PM   Radiology No results found.  Procedures Procedures (including critical care time)  Medications Ordered in ED Medications  sodium chloride 0.9 % bolus 1,000 mL (0 mLs Intravenous Stopped 12/07/18 1527)     Initial Impression / Assessment and Plan / ED Course  I have reviewed the triage vital signs and the nursing notes.  Pertinent labs & imaging results that were available during my care of the patient were reviewed by me and considered in my medical decision making (see chart for details).  Clinical Course as of Dec 08 602  Wed Dec 07, 2018  7270 23 year old female here with very vague symptoms of lightheadedness shortness of breath and intermittent headaches for the last 2 days.  Differential diagnosis includes viral syndrome, Covid, dehydration, anxiety, tension headaches.  She is very well-appearing with a normal neurologic exam.  Checking some screening labs urinalysis pregnancy test.   [MB]  1324 Patient is PERC negative.  Sats 100% room air.   [MB]    Clinical Course User Index [MB] Terrilee Files, MD   Laura Mendoza was evaluated in Emergency Department on 12/07/2018 for the symptoms described in the history of present illness. She was evaluated in the  context of the global COVID-19 pandemic, which necessitated consideration that the patient might be at risk for infection with the SARS-CoV-2 virus that causes COVID-19. Institutional protocols and algorithms that pertain to the evaluation of patients at risk for COVID-19 are in a state of rapid change based on information released by regulatory bodies including the CDC and federal and state organizations. These policies and algorithms were followed during the patient's care in the ED.      Final Clinical Impressions(s) / ED Diagnoses   Final diagnoses:  Lightheadedness    ED Discharge Orders    None       Terrilee Files, MD 12/08/18 [phone]

## 2018-12-07 NOTE — ED Triage Notes (Signed)
Pt states lightheadedness and sob x 2 days. Denies any cough, fevers, syncope or cp.

## 2018-12-07 NOTE — ED Triage Notes (Signed)
Pt requesting STD screening; pt sts possible exposure to chlamydia; pt sts not feeling well x 2 days also

## 2018-12-07 NOTE — Discharge Instructions (Signed)
You were seen in the emergency department for feeling lightheaded and intermittent shortness of breath headache and feeling hot and cold.  You had blood work and a urine test that did not show an obvious explanation for your symptoms.  Please still well-hydrated and you can use Tylenol or ibuprofen as needed for headache.  Follow-up with your doctor and return if any worsening symptoms.

## 2018-12-07 NOTE — ED Provider Notes (Signed)
Care transferred to me.  Patient has a little bit of dizziness but feels better with the IV fluids.  Her vitals are unremarkable.  She does not have a UTI and is not pregnant.  Other labs benign.  She appears stable for discharge home and outpatient follow-up and we discussed return precautions.  Results for orders placed or performed during the hospital encounter of 12/07/18  Basic metabolic panel  Result Value Ref Range   Sodium 138 135 - 145 mmol/L   Potassium 3.5 3.5 - 5.1 mmol/L   Chloride 107 98 - 111 mmol/L   CO2 21 (L) 22 - 32 mmol/L   Glucose, Bld 91 70 - 99 mg/dL   BUN <5 (L) 6 - 20 mg/dL   Creatinine, Ser 9.48 0.44 - 1.00 mg/dL   Calcium 9.2 8.9 - 01.6 mg/dL   GFR calc non Af Amer >60 >60 mL/min   GFR calc Af Amer >60 >60 mL/min   Anion gap 10 5 - 15  CBC with Differential  Result Value Ref Range   WBC 4.9 4.0 - 10.5 K/uL   RBC 4.29 3.87 - 5.11 MIL/uL   Hemoglobin 12.9 12.0 - 15.0 g/dL   HCT 55.3 74.8 - 27.0 %   MCV 94.9 80.0 - 100.0 fL   MCH 30.1 26.0 - 34.0 pg   MCHC 31.7 30.0 - 36.0 g/dL   RDW 78.6 75.4 - 49.2 %   Platelets 239 150 - 400 K/uL   nRBC 0.0 0.0 - 0.2 %   Neutrophils Relative % 62 %   Neutro Abs 3.1 1.7 - 7.7 K/uL   Lymphocytes Relative 28 %   Lymphs Abs 1.4 0.7 - 4.0 K/uL   Monocytes Relative 9 %   Monocytes Absolute 0.4 0.1 - 1.0 K/uL   Eosinophils Relative 1 %   Eosinophils Absolute 0.0 0.0 - 0.5 K/uL   Basophils Relative 0 %   Basophils Absolute 0.0 0.0 - 0.1 K/uL   Immature Granulocytes 0 %   Abs Immature Granulocytes 0.01 0.00 - 0.07 K/uL  Urinalysis, Routine w reflex microscopic  Result Value Ref Range   Color, Urine YELLOW YELLOW   APPearance HAZY (A) CLEAR   Specific Gravity, Urine 1.009 1.005 - 1.030   pH 6.0 5.0 - 8.0   Glucose, UA NEGATIVE NEGATIVE mg/dL   Hgb urine dipstick LARGE (A) NEGATIVE   Bilirubin Urine NEGATIVE NEGATIVE   Ketones, ur 5 (A) NEGATIVE mg/dL   Protein, ur NEGATIVE NEGATIVE mg/dL   Nitrite NEGATIVE NEGATIVE    Leukocytes,Ua NEGATIVE NEGATIVE   RBC / HPF 0-5 0 - 5 RBC/hpf   WBC, UA 0-5 0 - 5 WBC/hpf   Bacteria, UA RARE (A) NONE SEEN   Squamous Epithelial / LPF 0-5 0 - 5   Mucus PRESENT   Pregnancy, urine  Result Value Ref Range   Preg Test, Ur NEGATIVE NEGATIVE   No results found.    Pricilla Loveless, MD 12/07/18 [phone]

## 2018-12-08 LAB — RPR: RPR Ser Ql: NONREACTIVE

## 2018-12-08 LAB — HIV ANTIBODY (ROUTINE TESTING W REFLEX): HIV Screen 4th Generation wRfx: NONREACTIVE

## 2018-12-08 NOTE — ED Provider Notes (Signed)
[facility]   [id number] 12/07/18 Arrival Time: 1756  ASSESSMENT & PLAN:  1. Possible exposure to STD    Low grade temp. No signs/symptoms of PID. No respiratory symptoms. Low suspicion for COVID-19. Discussed.   Discharge Instructions     You have been given the following medications today for treatment of suspected gonorrhea and/or chlamydia:  cefTRIAXone (ROCEPHIN) injection 250 mg azithromycin (ZITHROMAX) tablet 1,000 mg  Even though we have treated you today, we have sent testing for sexually transmitted infections. We will notify you of any positive results once they are received. If required, we will prescribe any medications you might need.  Please refrain from all sexual activity for at least the next seven days.     Pending: Labs Reviewed  RPR  HIV ANTIBODY (ROUTINE TESTING W REFLEX)  CERVICOVAGINAL ANCILLARY ONLY    Will notify of any positive results. Instructed to refrain from sexual activity for at least seven days.  Reviewed expectations re: course of current medical issues. Questions answered. Outlined signs and symptoms indicating need for more acute intervention. Patient verbalized understanding. After Visit Summary given.   SUBJECTIVE:  Laura Mendoza is a 23 y.o. female who is here and reports possible STD exposure. Requests screening. Sexually active with one female partner. No specific vaginal discharge or urinary frequency/dysuria. "Haven't been feeling really well" for a couple of days. No known or suspected fever. No respiratory symptoms.  No abdominal or pelvic pain. No n/v. No rashes or lesions. Normal PO intake. Ambulatory without difficulty. OTC treatment: none reported.  Patient's last menstrual period was 12/04/2018.  ROS: As per HPI. All other systems negative.   OBJECTIVE:  Vitals:   12/07/18 1826  BP: 133/75  Pulse: (!) 55  Resp: 18  Temp: 99.3 F (37.4 C)  TempSrc: Oral  SpO2: 95%     General  appearance: alert, cooperative, appears stated age and no distress Throat: lips, mucosa, and tongue normal; teeth and gums normal CV: RRR Lungs: CTAB Back: no CVA tenderness; FROM at waist Abdomen: soft, non-tender GU: deferred Skin: warm and dry Psychological: alert and cooperative; normal mood and affect.   Labs Reviewed  RPR  HIV ANTIBODY (ROUTINE TESTING W REFLEX)  CERVICOVAGINAL ANCILLARY ONLY    No Known Allergies  Past Medical History:  Diagnosis Date  . Anxiety   . Chlamydia 2016  . UTI (urinary tract infection)   . Vaginal yeast infection    Family History  Problem Relation Age of Onset  . Hypertension Mother   . Irregular heart beat Mother   . Kidney Stones Father   . Hypertension Maternal Grandmother   . Kidney disease Maternal Grandmother   . Diabetes Maternal Grandmother   . Heart failure Maternal Grandmother   . Colon cancer Neg Hx   . Colon polyps Neg Hx   . Esophageal cancer Neg Hx   . Rectal cancer Neg Hx   . Stomach cancer Neg Hx   . Neuromuscular disorder Neg Hx    Social History   Socioeconomic History  . Marital status: Single    Spouse name: Not on file  . Number of children: 0  . Years of education: Some college  . Highest education level: Not on file  Occupational History  . Occupation: N/A  Social Needs  . Financial resource strain: Not on file  . Food insecurity:    Worry: Not on file    Inability: Not on file  . Transportation needs:    Medical:  Not on file    Non-medical: Not on file  Tobacco Use  . Smoking status: Never Smoker  . Smokeless tobacco: Never Used  Substance and Sexual Activity  . Alcohol use: Yes  . Drug use: No  . Sexual activity: Yes    Birth control/protection: Condom  Lifestyle  . Physical activity:    Days per week: Not on file    Minutes per session: Not on file  . Stress: Not on file  Relationships  . Social connections:    Talks on phone: Not on file    Gets together: Not on file    Attends  religious service: Not on file    Active member of club or organization: Not on file    Attends meetings of clubs or organizations: Not on file    Relationship status: Not on file  . Intimate partner violence:    Fear of current or ex partner: Not on file    Emotionally abused: Not on file    Physically abused: Not on file    Forced sexual activity: Not on file  Other Topics Concern  . Not on file  Social History Narrative   Lives at home w/ her mom, sister, niece and nephew   Right-handed   Caffeine: sweet tea weekly          Mardella LaymanHagler, Arno Cullers, MD 12/08/18 [phone]

## 2018-12-09 ENCOUNTER — Telehealth (HOSPITAL_COMMUNITY): Payer: Self-pay | Admitting: Emergency Medicine

## 2018-12-09 LAB — CERVICOVAGINAL ANCILLARY ONLY
Bacterial vaginitis: POSITIVE — AB
Candida vaginitis: NEGATIVE
Chlamydia: NEGATIVE
Neisseria Gonorrhea: NEGATIVE
Trichomonas: NEGATIVE

## 2018-12-09 MED ORDER — METRONIDAZOLE 500 MG PO TABS: 500.0000 mg | ORAL_TABLET | Freq: Two times a day (BID) | ORAL | 0 refills | Status: AC

## 2018-12-09 NOTE — Telephone Encounter (Signed)
Bacterial vaginosis is positive. This was not treated at the urgent care visit.  Flagyl 500 mg BID x 7 days #14 no refills sent to patients pharmacy of choice.    Patient contacted and made aware of all results, all questions answered.  

## 2018-12-09 NOTE — Telephone Encounter (Signed)
Sending medicine to pharmacy

## 2018-12-26 ENCOUNTER — Encounter (HOSPITAL_COMMUNITY): Payer: Self-pay | Admitting: Emergency Medicine

## 2018-12-26 ENCOUNTER — Ambulatory Visit (HOSPITAL_COMMUNITY)
Admission: EM | Admit: 2018-12-26 | Discharge: 2018-12-26 | Disposition: A | Discharge status: Home / Self Care | Payer: 59 | Attending: Family Medicine | Admitting: Family Medicine

## 2018-12-26 ENCOUNTER — Other Ambulatory Visit: Payer: Self-pay

## 2018-12-26 DIAGNOSIS — H1132 Conjunctival hemorrhage, left eye: Secondary | ICD-10-CM

## 2018-12-26 DIAGNOSIS — S0083XA Contusion of other part of head, initial encounter: Secondary | ICD-10-CM | POA: Diagnosis not present

## 2018-12-26 DIAGNOSIS — S0502XA Injury of conjunctiva and corneal abrasion without foreign body, left eye, initial encounter: Secondary | ICD-10-CM | POA: Diagnosis not present

## 2018-12-26 MED ORDER — FLUORESCEIN SODIUM 1 MG OP STRP
ORAL_STRIP | OPHTHALMIC | Status: AC
  Filled 2018-12-26: qty 1

## 2018-12-26 MED ORDER — TETRACAINE HCL 0.5 % OP SOLN
OPHTHALMIC | Status: AC
  Filled 2018-12-26: qty 4

## 2018-12-26 MED ORDER — POLYVINYL ALCOHOL 1.4 % OP SOLN: 1.0000 [drp] | OPHTHALMIC | 0 refills | Status: DC | PRN

## 2018-12-26 MED ORDER — ERYTHROMYCIN 5 MG/GM OP OINT: TOPICAL_OINTMENT | OPHTHALMIC | 0 refills | Status: DC
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
## 2018-12-26 NOTE — Discharge Instructions (Signed)
Use eyedrop lubricant daily for symptom relief.  May use erythromycin ointment every night to prevent infection and heal corneal abrasion. Follow-up with ophthalmology if symptoms worsen (change/ of vision, spread of redness in eye or bruising). Go to ER if you develop sudden, severe headache, sudden hearing loss, or inability to move your eye.
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
## 2019-02-07 ENCOUNTER — Encounter (HOSPITAL_COMMUNITY): Payer: Self-pay | Admitting: Obstetrics and Gynecology

## 2019-02-07 ENCOUNTER — Other Ambulatory Visit: Payer: Self-pay

## 2019-02-07 ENCOUNTER — Emergency Department (HOSPITAL_COMMUNITY)
Admission: EM | Admit: 2019-02-07 | Discharge: 2019-02-07 | Disposition: A | Discharge status: Home / Self Care | Payer: 59 | Attending: Emergency Medicine | Admitting: Emergency Medicine

## 2019-02-07 ENCOUNTER — Emergency Department (HOSPITAL_COMMUNITY): Payer: 59

## 2019-02-07 DIAGNOSIS — O99891 Other specified diseases and conditions complicating pregnancy: Secondary | ICD-10-CM

## 2019-02-07 DIAGNOSIS — Z3A01 Less than 8 weeks gestation of pregnancy: Secondary | ICD-10-CM | POA: Insufficient documentation

## 2019-02-07 DIAGNOSIS — R8271 Bacteriuria: Secondary | ICD-10-CM

## 2019-02-07 DIAGNOSIS — B9689 Other specified bacterial agents as the cause of diseases classified elsewhere: Secondary | ICD-10-CM

## 2019-02-07 DIAGNOSIS — O23591 Infection of other part of genital tract in pregnancy, first trimester: Secondary | ICD-10-CM | POA: Insufficient documentation

## 2019-02-07 DIAGNOSIS — N898 Other specified noninflammatory disorders of vagina: Secondary | ICD-10-CM | POA: Diagnosis present

## 2019-02-07 DIAGNOSIS — Z3201 Encounter for pregnancy test, result positive: Secondary | ICD-10-CM

## 2019-02-07 DIAGNOSIS — B373 Candidiasis of vulva and vagina: Secondary | ICD-10-CM

## 2019-02-07 DIAGNOSIS — R102 Pelvic and perineal pain: Secondary | ICD-10-CM | POA: Insufficient documentation

## 2019-02-07 DIAGNOSIS — B3731 Acute candidiasis of vulva and vagina: Secondary | ICD-10-CM

## 2019-02-07 DIAGNOSIS — O9989 Other specified diseases and conditions complicating pregnancy, childbirth and the puerperium: Secondary | ICD-10-CM | POA: Diagnosis not present

## 2019-02-07 DIAGNOSIS — N760 Acute vaginitis: Secondary | ICD-10-CM

## 2019-02-07 LAB — URINALYSIS, ROUTINE W REFLEX MICROSCOPIC
Bilirubin Urine: NEGATIVE
Glucose, UA: NEGATIVE mg/dL
Hgb urine dipstick: NEGATIVE
Ketones, ur: NEGATIVE mg/dL
Nitrite: NEGATIVE
Protein, ur: NEGATIVE mg/dL
Specific Gravity, Urine: 1.017 (ref 1.005–1.030)
pH: 7 (ref 5.0–8.0)

## 2019-02-07 LAB — BASIC METABOLIC PANEL
Anion gap: 11 (ref 5–15)
BUN: 6 mg/dL (ref 6–20)
CO2: 21 mmol/L — ABNORMAL LOW (ref 22–32)
Calcium: 8.9 mg/dL (ref 8.9–10.3)
Chloride: 106 mmol/L (ref 98–111)
Creatinine, Ser: 0.6 mg/dL (ref 0.44–1.00)
GFR calc Af Amer: >60 mL/min (ref >60)
GFR calc non Af Amer: >60 mL/min (ref >60)
Glucose, Bld: 79 mg/dL (ref 70–99)
Potassium: 3.5 mmol/L (ref 3.5–5.1)
Sodium: 138 mmol/L (ref 135–145)

## 2019-02-07 LAB — CBC
HCT: 38.4 % (ref 36.0–46.0)
Hemoglobin: 12.4 g/dL (ref 12.0–15.0)
MCH: 30.2 pg (ref 26.0–34.0)
MCHC: 32.3 g/dL (ref 30.0–36.0)
MCV: 93.4 fL (ref 80.0–100.0)
Platelets: 264 10*3/uL (ref 150–400)
RBC: 4.11 MIL/uL (ref 3.87–5.11)
RDW: 13.8 % (ref 11.5–15.5)
WBC: 10.8 10*3/uL — ABNORMAL HIGH (ref 4.0–10.5)
nRBC: 0 % (ref 0.0–0.2)

## 2019-02-07 LAB — PREGNANCY, URINE: Preg Test, Ur: POSITIVE — AB

## 2019-02-07 LAB — WET PREP, GENITAL
Sperm: NONE SEEN
Trich, Wet Prep: NONE SEEN

## 2019-02-07 LAB — HCG, QUANTITATIVE, PREGNANCY: hCG, Beta Chain, Quant, S: 4497 m[IU]/mL — ABNORMAL HIGH (ref <5)

## 2019-02-07 MED ORDER — CLOTRIMAZOLE 1 % VA CREA: 1.0000 | TOPICAL_CREAM | Freq: Every day | VAGINAL | 0 refills | Status: AC

## 2019-02-07 MED ORDER — METRONIDAZOLE 500 MG PO TABS: 500.0000 mg | ORAL_TABLET | Freq: Two times a day (BID) | ORAL | 0 refills | Status: DC

## 2019-02-07 MED ORDER — CEPHALEXIN 500 MG PO CAPS: 500.0000 mg | ORAL_CAPSULE | Freq: Two times a day (BID) | ORAL | 0 refills | Status: DC

## 2019-02-07 NOTE — Discharge Instructions (Addendum)
You were seen in the emergency department today for vaginal discharge.   Your pregnancy test was positive.  Your ultrasound showed an intra-uterine pregnancy that is about 5 weeks & 2 days- we were unable to see a heart beat therefore you will need a repeat ultrasound & a repeat quantitative hCG in 14 days to further assess.   In the meantime please start taking prenatal vitamins, discussed with the pharmacist.   Please discontinue all medication, take in pregnancy, please also discuss with the pharmacist.  You may take Tylenol per over-the-counter dosing to help with discomfort.  Your wet prep showed yeast and bacterial vaginosis, we are treating the yeast with an intravaginal therapy for the next 7 days we are also sending her with Flagyl to take twice per day for the next 7 days.  Your urine showed some bacteria which were treated with Keflex.  We have prescribed you new medication(s) today. Discuss the medications prescribed today with your pharmacist as they can have adverse effects and interactions with your other medicines including over the counter and prescribed medications. Seek medical evaluation if you start to experience new or abnormal symptoms after taking one of these medicines, seek care immediately if you start to experience difficulty breathing, feeling of your throat closing, facial swelling, or rash as these could be indications of a more serious allergic reaction  Please do not drink alcohol smoke, or do drugs while being pregnant.  Please call OB/GYN in your discharge instructions for a follow-up appointment within 1 week and as well to have them schedule your repeat ultrasound and repeat labs.  Return to the ER for new or worsening symptoms including but not limited to pelvic pain, vaginal bleeding, inability to keep fluids down, or any other concerns.

## 2019-02-07 NOTE — ED Triage Notes (Signed)
Pt reports patient that she has missed her period this month and that she has a curd like discharge from her vagina and vaginal itching. Pt believes she has a yeast infection and may be pregnant

## 2019-02-07 NOTE — ED Provider Notes (Signed)
[facility]-EMERGENCY DEPT Provider Note   CSN: [id number] Arrival date & time: 02/07/19  1328     History   Chief Complaint Chief Complaint  Patient presents with  . Vaginitis  . Possible Pregnancy    HPI Laura Mendoza is a 23 y.o. female with a hx of anxiety as well as prior yeast & chlamydia infections who presents to the ED with complaints of vaginal discharge x 1 week & concern for pregnancy.  Patient states she has had a thick curd like pruritic vaginal discharge for about 1 week w/o alleviating/aggravating factors. She states this feels similar to prior yeast infections. She has had some nausea & mild intermittent pelvic cramping w/o alleviating/aggravating factors that almost feels like period cramps. Her LMP was @ the beginning of June and was fairly light, she is concerned that with late cycle in July she could be pregnant. Currently sexually active w/ 1 female partner w/o protection w/o concern for STDs. Denies fever, chills, emesis, vaginal bleeding, diarrhea, or dysuria. Has not been pregnant previously.      HPI  Past Medical History:  Diagnosis Date  . Anxiety   . Chlamydia 2016  . UTI (urinary tract infection)   . Vaginal yeast infection     Patient Active Problem List   Diagnosis Date Noted  . 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 NO PAST SURGERIES       OB History   No obstetric history on file.      Home Medications    Prior to Admission medications   Medication Sig Start Date End Date Taking? Authorizing Provider  benzonatate (TESSALON) 100 MG capsule Take 1 capsule (100 mg total) by mouth every 8 (eight) hours. Patient not taking: Reported on 12/07/2018 08/14/18   Johnette Teigen, Lelon MastSamantha R, PA-C  erythromycin ophthalmic ointment Place a 1/2 inch ribbon of ointment into the lower eyelid. 12/26/18   Hall-Potvin, [place], PA-C  fluticasone (FLONASE) 50 MCG/ACT nasal  spray Place 1 spray into both nostrils daily. Patient not taking: Reported on 12/07/2018 08/14/18   Jerriann Schrom, Pleas KochSamantha R, PA-C  ibuprofen (ADVIL,MOTRIN) 200 MG tablet Take 200 mg by mouth every 6 (six) hours as needed for headache.    [provider]  methocarbamol (ROBAXIN) 500 MG tablet Take 1 tablet (500 mg total) by mouth every 8 (eight) hours as needed for muscle spasms. Patient not taking: Reported on 12/07/2018 08/14/18   Ardie Mclennan, Lelon MastSamantha R, PA-C  metroNIDAZOLE (METROGEL VAGINAL) 0.75 % vaginal gel Place 1 Applicatorful vaginally 2 (two) times daily. Patient not taking: Reported on 12/07/2018 11/10/17   Elpidio AnisUpstill, Shari, PA-C  naproxen (NAPROSYN) 500 MG tablet Take 1 tablet (500 mg total) by mouth 2 (two) times daily. Patient not taking: Reported on 12/07/2018 08/14/18   Caley Volkert, Pleas KochSamantha R, PA-C  Polyethyl Glycol-Propyl Glycol (SYSTANE) 0.4-0.3 % GEL ophthalmic gel Place 1 application into both eyes at bedtime as needed. 09/23/18   Cathie HoopsYu, Amy V, PA-C  polyvinyl alcohol (LUBRICANT DROPS) 1.4 % ophthalmic solution Place 1 drop into the left eye as needed for dry eyes. 12/26/18   Hall-Potvin, [place], PA-C    Family History Family History  Problem Relation Age of Onset  . Hypertension Mother   . Irregular heart beat Mother   . Kidney Stones Father   . Hypertension Maternal Grandmother   . Kidney disease Maternal Grandmother   . Diabetes Maternal Grandmother   . Heart failure Maternal  Grandmother   . Colon cancer Neg Hx   . Colon polyps Neg Hx   . Esophageal cancer Neg Hx   . Rectal cancer Neg Hx   . Stomach cancer Neg Hx   . Neuromuscular disorder Neg Hx     Social History Social History   Tobacco Use  . Smoking status: Never Smoker  . Smokeless tobacco: Never Used  Substance Use Topics  . Alcohol use: Yes  . Drug use: No     Allergies   Patient has no known allergies.   Review of Systems Review of Systems  Constitutional: Negative for chills and fever.   Respiratory: Negative for shortness of breath.   Cardiovascular: Negative for chest pain.  Gastrointestinal: Positive for nausea. Negative for blood in stool, constipation, diarrhea and vomiting.  Genitourinary: Positive for pelvic pain and vaginal discharge. Negative for dysuria and vaginal bleeding.  All other systems reviewed and are negative.    Physical Exam Updated Vital Signs BP 105/68   Pulse 75   Temp 99.6 F (37.6 C)   Resp 13   LMP 12/28/2018   SpO2 100%   Physical Exam Vitals signs and nursing note reviewed. Exam conducted with a chaperone present.  Constitutional:      General: She is not in acute distress.    Appearance: She is well-developed. She is not toxic-appearing.  HENT:     Head: Normocephalic and atraumatic.  Eyes:     General:        Right eye: No discharge.        Left eye: No discharge.     Conjunctiva/sclera: Conjunctivae normal.  Neck:     Musculoskeletal: Neck supple.  Cardiovascular:     Rate and Rhythm: Normal rate and regular rhythm.  Pulmonary:     Effort: Pulmonary effort is normal. No respiratory distress.     Breath sounds: Normal breath sounds. No wheezing, rhonchi or rales.  Abdominal:     General: There is no distension.     Palpations: Abdomen is soft.     Tenderness: There is no abdominal tenderness. There is no guarding or rebound.  Genitourinary:    Exam position: Supine.     Labia:        Right: No lesion.        Left: No lesion.      Vagina: Vaginal discharge (white, moderate amount. ) present.     Cervix: No cervical motion tenderness or friability.     Adnexa:        Right: No mass, tenderness or fullness.         Left: No mass, tenderness or fullness.       Comments: Mild erythema @ vaginal introitus.  Skin:    General: Skin is warm and dry.     Findings: No rash.  Neurological:     Mental Status: She is alert.     Comments: Clear speech.   Psychiatric:        Behavior: Behavior normal.    ED Treatments /  Results  Labs (all labs ordered are listed, but only abnormal results are displayed) Labs Reviewed  WET PREP, GENITAL - Abnormal; Notable for the following components:      Result Value   Yeast Wet Prep HPF POC PRESENT (*)    Clue Cells Wet Prep HPF POC PRESENT (*)    WBC, Wet Prep HPF POC FEW (*)    All other components within normal limits  URINALYSIS, ROUTINE W  REFLEX MICROSCOPIC - Abnormal; Notable for the following components:   Leukocytes,Ua TRACE (*)    Bacteria, UA RARE (*)    All other components within normal limits  PREGNANCY, URINE - Abnormal; Notable for the following components:   Preg Test, Ur POSITIVE (*)    All other components within normal limits  CBC - Abnormal; Notable for the following components:   WBC 10.8 (*)    All other components within normal limits  BASIC METABOLIC PANEL - Abnormal; Notable for the following components:   CO2 21 (*)    All other components within normal limits  HCG, QUANTITATIVE, PREGNANCY - Abnormal; Notable for the following components:   hCG, Beta Chain, Quant, S 4,497 (*)    All other components within normal limits  URINE CULTURE  RPR  HIV ANTIBODY (ROUTINE TESTING W REFLEX)  GC/CHLAMYDIA PROBE AMP ([place]) NOT AT [facility]    EKG None  Radiology US Ob Comp < 14 Wks  Result Date: 02/07/2019 CLINICAL DATA:  Pelvic cramping. Five weeks and 2 days pregnant by last menstrual period. Positive urine pregnancy test. EXAM: OBSTETRIC <14 WK [place] AND TRANSVAGINAL OB US TECHNIQUE: Both transabdominal and transvaginal ultrasound examinations were performed for complete evaluation of the gestation as well as the maternal uterus, adnexal regions, and pelvic cul-de-sac. Transvaginal technique was performed to assess early pregnancy. COMPARISON:  Abdomen and pelvis CT dated 07/25/2017. FINDINGS: Intrauterine gestational sac: Visualized Yolk sac:  Not visualized Embryo:  Not visualized MSD: 5.8 mm mm   5 w   2 d Subchorionic hemorrhage:  None  visualized. Maternal uterus/adnexae: Normal appearing ovaries with a right ovarian corpus luteum noted. Small amount of free peritoneal fluid. IMPRESSION: 1. Intrauterine gestational sac with an estimated gestational age of [redacted] weeks and 2 days with no yolk sac, fetal pole, or cardiac activity yet visualized. Recommend follow-up quantitative B-HCG levels and follow-up US in 14 days to assess viability. This recommendation follows SRU consensus guidelines: Diagnostic Criteria for Nonviable Pregnancy Early in the First Trimester. [employer] Med 2013; 062:6948-54. 2. Small amount of free peritoneal fluid. Electronically Signed   By: Claudie Revering M.D.   On: 02/07/2019 18:24   US Ob Transvaginal  Result Date: 02/07/2019 CLINICAL DATA:  Pelvic cramping. Five weeks and 2 days pregnant by last menstrual period. Positive urine pregnancy test. EXAM: OBSTETRIC <14 WK [place] AND TRANSVAGINAL OB US TECHNIQUE: Both transabdominal and transvaginal ultrasound examinations were performed for complete evaluation of the gestation as well as the maternal uterus, adnexal regions, and pelvic cul-de-sac. Transvaginal technique was performed to assess early pregnancy. COMPARISON:  Abdomen and pelvis CT dated 07/25/2017. FINDINGS: Intrauterine gestational sac: Visualized Yolk sac:  Not visualized Embryo:  Not visualized MSD: 5.8 mm mm   5 w   2 d Subchorionic hemorrhage:  None visualized. Maternal uterus/adnexae: Normal appearing ovaries with a right ovarian corpus luteum noted. Small amount of free peritoneal fluid. IMPRESSION: 1. Intrauterine gestational sac with an estimated gestational age of [redacted] weeks and 2 days with no yolk sac, fetal pole, or cardiac activity yet visualized. Recommend follow-up quantitative B-HCG levels and follow-up US in 14 days to assess viability. This recommendation follows SRU consensus guidelines: Diagnostic Criteria for Nonviable Pregnancy Early in the First Trimester. [employer] Med 2013; 627:0350-09. 2. Small  amount of free peritoneal fluid. Electronically Signed   By: Claudie Revering M.D.   On: 02/07/2019 18:24    Procedures Procedures (including critical care time)  Medications Ordered  in ED Medications - No data to display   Initial Impression / Assessment and Plan / ED Course  I have reviewed the triage vital signs and the nursing notes.  Pertinent labs & imaging results that were available during my care of the patient were reviewed by me and considered in my medical decision making (see chart for details).   Patient presents to the ED w/ complaints of vaginal discharge & concern for pregnancy.  Nontoxic, vitals WNL, exam benign- some vaginal discharge but no abdominal, adnexal, or cervical motion tenderness- doubt PID. Preg test positive- proceed w/ labs & US given intermittent abdominal cramping reported.   CBC: Nonspecific leukocytosis @ 10.8. no anemia.  BMP: No significant electrolyte derangement  Wet prep: BV & yeast.  UA: Trace leuks, rare bacteria--culture.  Quant: 8,119: 4,497 US: 1. Intrauterine gestational sac with an estimated gestational age of [redacted] weeks and 2 days with no yolk sac, fetal pole, or cardiac activity yet visualized. Recommend follow-up quantitative B-HCG levels and follow-up US in 14 days to assess viability.    BV- oral flagyl Yeast- intravaginal anti-fungal UA- keflex for asymptomatic bacteriuria per discussion w/ Dr. Rush Landmarkegeler.  Pregnancy- start prenatals, obgyn f/u for repeat quant & US as well as further general pregnancy management.   I discussed results, treatment plan, need for follow-up, and return precautions with the patient. Provided opportunity for questions, patient confirmed understanding and is in agreement with plan.   Findings and plan of care discussed with supervising physician Dr. Rush Landmarkegeler who is in agreement.   Final Clinical Impressions(s) / ED Diagnoses   Final diagnoses:  Positive pregnancy test  Bacterial vaginosis  Yeast vaginitis   Asymptomatic bacteriuria during pregnancy    ED Discharge Orders         Ordered    metroNIDAZOLE (FLAGYL) 500 MG tablet  2 times daily     02/07/19 1930    clotrimazole (GYNE-LOTRIMIN) 1 % vaginal cream  Daily at bedtime     02/07/19 1930    cephALEXin (KEFLEX) 500 MG capsule  2 times daily     02/07/19 1930           Cherly Andersonetrucelli, Mande Auvil R, PA-C 02/07/19 1931    Tegeler, Canary Brimhristopher J, MD 02/07/19 2248

## 2019-02-08 LAB — GC/CHLAMYDIA PROBE AMP (~~LOC~~) NOT AT ARMC
Chlamydia: NEGATIVE
Neisseria Gonorrhea: NEGATIVE

## 2019-02-08 LAB — RPR: RPR Ser Ql: NONREACTIVE

## 2019-02-08 LAB — HIV ANTIBODY (ROUTINE TESTING W REFLEX): HIV Screen 4th Generation wRfx: NONREACTIVE

## 2019-02-08 LAB — URINE CULTURE

## 2019-02-24 ENCOUNTER — Telehealth: Payer: Self-pay | Admitting: Obstetrics & Gynecology

## 2019-02-24 NOTE — Telephone Encounter (Signed)
Called the patient to inform of upcoming appointment. The telephone line was picked up however no one made contact. I Danae Chen) stated: hello. Continued silence and the call ended.

## 2019-02-27 ENCOUNTER — Other Ambulatory Visit: Payer: Self-pay

## 2019-02-27 ENCOUNTER — Ambulatory Visit (INDEPENDENT_AMBULATORY_CARE_PROVIDER_SITE_OTHER): Payer: 59 | Admitting: *Deleted

## 2019-02-27 DIAGNOSIS — Z3490 Encounter for supervision of normal pregnancy, unspecified, unspecified trimester: Secondary | ICD-10-CM

## 2019-02-27 NOTE — Progress Notes (Signed)
Per chart patient scheduled for new ob intake but per chart review had Ed visit confirming pregnancy but [place] did not show embryo and heart activity.  Discussed with Dr. Kennon Rounds and will delay new ob intake until after another [place] can be done to confirm viability of pregnancy.  [place] can be first available .  I called and scheduled [place] for 03/09/19 3pm. I called Laura Mendoza and discussed how she is doing since Ed visit. She denies any pain or bleeding. I   explained we will delay her new ob intake until we can get another [place] that confirms she has a good pregnancy and how far along she is.  I reviewed her appointment with her.  I also informed her she will get 2 new appts for new ob intake and new ob. She voices understanding. Vaughan Basta, RN

## 2019-02-27 NOTE — Progress Notes (Signed)
Patient seen and assessed by nursing staff.  Agree with documentation and plan.  

## 2019-03-07 ENCOUNTER — Encounter: Payer: 59 | Admitting: Advanced Practice Midwife

## 2019-03-09 ENCOUNTER — Ambulatory Visit (HOSPITAL_COMMUNITY)
Admission: RE | Admit: 2019-03-09 | Discharge: 2019-03-09 | Disposition: A | Discharge status: Home / Self Care | Payer: 59 | Source: Ambulatory Visit | Attending: Family Medicine | Admitting: Family Medicine

## 2019-03-09 ENCOUNTER — Other Ambulatory Visit: Payer: Self-pay

## 2019-03-09 ENCOUNTER — Ambulatory Visit: Payer: 59

## 2019-03-09 DIAGNOSIS — Z3490 Encounter for supervision of normal pregnancy, unspecified, unspecified trimester: Secondary | ICD-10-CM | POA: Diagnosis not present

## 2019-03-31 ENCOUNTER — Other Ambulatory Visit: Payer: Self-pay

## 2019-03-31 ENCOUNTER — Encounter (HOSPITAL_COMMUNITY): Payer: Self-pay | Admitting: *Deleted

## 2019-03-31 ENCOUNTER — Inpatient Hospital Stay (HOSPITAL_COMMUNITY)
Admission: AD | Admit: 2019-03-31 | Discharge: 2019-03-31 | Disposition: A | Discharge status: Home / Self Care | Payer: 59 | Attending: Obstetrics and Gynecology | Admitting: Obstetrics and Gynecology

## 2019-03-31 DIAGNOSIS — O26891 Other specified pregnancy related conditions, first trimester: Secondary | ICD-10-CM | POA: Diagnosis not present

## 2019-03-31 DIAGNOSIS — Z3A13 13 weeks gestation of pregnancy: Secondary | ICD-10-CM

## 2019-03-31 DIAGNOSIS — R109 Unspecified abdominal pain: Secondary | ICD-10-CM | POA: Insufficient documentation

## 2019-03-31 DIAGNOSIS — O26899 Other specified pregnancy related conditions, unspecified trimester: Secondary | ICD-10-CM

## 2019-03-31 LAB — URINALYSIS, ROUTINE W REFLEX MICROSCOPIC
Bilirubin Urine: NEGATIVE
Glucose, UA: NEGATIVE mg/dL
Hgb urine dipstick: NEGATIVE
Ketones, ur: NEGATIVE mg/dL
Leukocytes,Ua: NEGATIVE
Nitrite: NEGATIVE
Protein, ur: NEGATIVE mg/dL
Specific Gravity, Urine: 1.016 (ref 1.005–1.030)
pH: 7 (ref 5.0–8.0)

## 2019-03-31 LAB — WET PREP, GENITAL
Clue Cells Wet Prep HPF POC: NONE SEEN
Sperm: NONE SEEN
Trich, Wet Prep: NONE SEEN
Yeast Wet Prep HPF POC: NONE SEEN

## 2019-03-31 NOTE — Discharge Instructions (Signed)
Warning Signs During Pregnancy °A pregnancy lasts about 40 weeks, starting from the first day of your last period until the baby is born. Pregnancy is divided into three phases called trimesters. °· The first trimester refers to week 1 through week 13 of pregnancy. °· The second trimester is the start of week 14 through the end of week 27. °· The third trimester is the start of week 28 until you deliver your baby. °During each trimester of pregnancy, certain signs and symptoms may indicate a problem. Talk with your health care provider about your current health and any medical conditions you have. Make sure you know the symptoms that you should watch for and report. °How does this affect me? ° °Warning signs in the first trimester °While some changes during the first trimester may be uncomfortable, most do not represent a serious problem. Let your health care provider know if you have any of the following warning signs in the first trimester: °· You cannot eat or drink without vomiting, and this lasts for longer than a day. °· You have vaginal bleeding or spotting along with menstrual-like cramping. °· You have diarrhea for longer than a day. °· You have a fever or other signs of infection, such as: °? Pain or burning when you urinate. °? Foul smelling or thick or yellowish vaginal discharge. °Warning signs in the second trimester °As your baby grows and changes during the second trimester, there are additional signs and symptoms that may indicate a problem. These include: °· Signs and symptoms of infection, including a fever. °· Signs or symptoms of a miscarriage or preterm labor, such as regular contractions, menstrual-like cramping, or lower abdominal pain. °· Bloody or watery vaginal discharge or obvious vaginal bleeding. °· Feeling like your heart is pounding. °· Having trouble breathing. °· Nausea, vomiting, or diarrhea that lasts for longer than a day. °· Craving non-food items, such as clay, chalk, or dirt.  This may be a sign of a very treatable medical condition called pica. °Later in your second trimester, watch for signs and symptoms of a serious medical condition called preeclampsia.These include: °· Changes in your vision. °· A severe headache that does not go away. °· Nausea and vomiting. °It is also important to notice if your baby stops moving or moves less than usual during this time. °Warning signs in the third trimester °As you approach the third trimester, your baby is growing and your body is preparing for the birth of your baby. In your third trimester, be sure to let your health care provider know if: °· You have signs and symptoms of infection, including a fever. °· You have vaginal bleeding. °· You notice that your baby is moving less than usual or is not moving. °· You have nausea, vomiting, or diarrhea that lasts for longer than a day. °· You have a severe headache that does not go away. °· You have vision changes, including seeing spots or having blurry or double vision. °· You have increased swelling in your hands or face. °How does this affect my baby? °Throughout your pregnancy, always report any of the warning signs of a problem to your health care provider. This can help prevent complications that may affect your baby, including: °· Increased risk for premature birth. °· Infection that may be transmitted to your baby. °· Increased risk for stillbirth. °Contact a health care provider if: °· You have any of the warning signs of a problem for the current trimester of your pregnancy. °·   Any of the following apply to you during any trimester of pregnancy: °? You have strong emotions, such as sadness or anxiety, that interfere with work or personal relationships. °? You feel unsafe in your home and need help finding a safe place to live. °? You are using tobacco products, alcohol, or drugs and you need help to stop. °Get help right away if: °You have signs or symptoms of labor before 37 weeks of  pregnancy. These include: °· Contractions that are 5 minutes or less apart, or that increase in frequency, intensity, or length. °· Sudden, sharp abdominal pain or low back pain. °· Uncontrolled gush or trickle of fluid from your vagina. °Summary °· A pregnancy lasts about 40 weeks, starting from the first day of your last period until the baby is born. Pregnancy is divided into three phases called trimesters. Each trimester has warning signs to watch for. °· Always report any warning signs to your health care provider in order to prevent complications that may affect both you and your baby. °· Talk with your health care provider about your current health and any medical conditions you have. Make sure you know the symptoms that you should watch for and report. °This information is not intended to replace advice given to you by your health care provider. Make sure you discuss any questions you have with your health care provider. °Document Released: 04/29/2017 Document Revised: 11/01/2018 Document Reviewed: 04/29/2017 °Elsevier Patient Education © 2020 [employer] ° °

## 2019-03-31 NOTE — MAU Provider Note (Signed)
Chief Complaint: Abdominal Pain   First Provider Initiated Contact with Patient 03/31/19 1556     SUBJECTIVE HPI: Laura Mendoza is a 23 y.o. G1P0 at [redacted]w[redacted]d who presents to [facility] Admissions reporting abdominal cramping. Reports "mild" abdominal cramping & states she just wants to get things checked out. Denies n/v/d, dysuria, vaginal bleeding, or vaginal discharge. Is going to [facility] for prenatal care. Has an appointment in a few weeks. When asked, is unsure if she's been in the office yet but thinks she was seen there a few weeks ago and "all they did" was an ultrasound and blood work. Can't remember if they did any kind of vaginal exam or vaginal swabs.   Location: abdomen Quality: cramping Severity: 3/10 on pain scale Duration: 1 week Timing: intermittent Modifying factors: none Associated signs and symptoms: none  Past Medical History:  Diagnosis Date  . Anxiety   . Chlamydia 2016  . UTI (urinary tract infection)   . Vaginal yeast infection    OB History  Gravida Para Term Preterm AB Living  1            SAB TAB Ectopic Multiple Live Births               # Outcome Date GA Lbr Len/2nd Weight Sex Delivery Anes PTL Lv  1 Current            Past Surgical History:  Procedure Laterality Date  . NO PAST SURGERIES     Social History   Socioeconomic History  . Marital status: Single    Spouse name: Not on file  . Number of children: 0  . Years of education: Some college  . Highest education level: Not on file  Occupational History  . Occupation: N/A  Social Needs  . Financial resource strain: Not on file  . Food insecurity    Worry: Not on file    Inability: Not on file  . Transportation needs    Medical: Not on file    Non-medical: Not on file  Tobacco Use  . Smoking status: Never Smoker  . Smokeless tobacco: Never Used  Substance and Sexual Activity  . Alcohol use: Yes  . Drug use: No  . Sexual activity: Yes    Birth control/protection: Condom  Lifestyle   . Physical activity    Days per week: Not on file    Minutes per session: Not on file  . Stress: Not on file  Relationships  . Social Herbalist on phone: Not on file    Gets together: Not on file    Attends religious service: Not on file    Active member of club or organization: Not on file    Attends meetings of clubs or organizations: Not on file    Relationship status: Not on file  . Intimate partner violence    Fear of current or ex partner: Not on file    Emotionally abused: Not on file    Physically abused: Not on file    Forced sexual activity: Not on file  Other Topics Concern  . Not on file  Social History Narrative   Lives at home w/ her mom, sister, niece and nephew   Right-handed   Caffeine: sweet tea weekly   Family History  Problem Relation Age of Onset  . Hypertension Mother   . Irregular heart beat Mother   . Kidney Stones Father   . Hypertension Maternal Grandmother   . Kidney disease Maternal  Grandmother   . Diabetes Maternal Grandmother   . Heart failure Maternal Grandmother   . Colon cancer Neg Hx   . Colon polyps Neg Hx   . Esophageal cancer Neg Hx   . Rectal cancer Neg Hx   . Stomach cancer Neg Hx   . Neuromuscular disorder Neg Hx    No current facility-administered medications on file prior to encounter.    Current Outpatient Medications on File Prior to Encounter  Medication Sig Dispense Refill  . benzonatate (TESSALON) 100 MG capsule Take 1 capsule (100 mg total) by mouth every 8 (eight) hours. (Patient not taking: Reported on 12/07/2018) 21 capsule 0  . cephALEXin (KEFLEX) 500 MG capsule Take 1 capsule (500 mg total) by mouth 2 (two) times daily. 10 capsule 0  . erythromycin ophthalmic ointment Place a 1/2 inch ribbon of ointment into the lower eyelid. (Patient not taking: Reported on 02/07/2019) 3.5 g 0  . fluticasone (FLONASE) 50 MCG/ACT nasal spray Place 1 spray into both nostrils daily. (Patient not taking: Reported on 12/07/2018)  16 g 0  . guaiFENesin (MUCUS & CHEST CONGESTION) 100 MG/5ML LIQD Take 15 mLs by mouth 2 (two) times daily as needed (congestion).    Marland Kitchen. ibuprofen (ADVIL,MOTRIN) 200 MG tablet Take 200 mg by mouth every 6 (six) hours as needed for headache.    . methocarbamol (ROBAXIN) 500 MG tablet Take 1 tablet (500 mg total) by mouth every 8 (eight) hours as needed for muscle spasms. (Patient not taking: Reported on 12/07/2018) 15 tablet 0  . metroNIDAZOLE (FLAGYL) 500 MG tablet Take 1 tablet (500 mg total) by mouth 2 (two) times daily. 14 tablet 0  . metroNIDAZOLE (METROGEL VAGINAL) 0.75 % vaginal gel Place 1 Applicatorful vaginally 2 (two) times daily. (Patient not taking: Reported on 12/07/2018) 70 g 0  . naproxen (NAPROSYN) 500 MG tablet Take 1 tablet (500 mg total) by mouth 2 (two) times daily. (Patient not taking: Reported on 12/07/2018) 10 tablet 0  . Polyethyl Glycol-Propyl Glycol (SYSTANE) 0.4-0.3 % GEL ophthalmic gel Place 1 application into both eyes at bedtime as needed. (Patient not taking: Reported on 02/07/2019) 1 Bottle 0  . polyvinyl alcohol (LUBRICANT DROPS) 1.4 % ophthalmic solution Place 1 drop into the left eye as needed for dry eyes. (Patient not taking: Reported on 02/07/2019) 15 mL 0   No Known Allergies  I have reviewed patient's Past Medical Hx, Surgical Hx, Family Hx, Social Hx, medications and allergies.   Review of Systems  Constitutional: Negative.   Gastrointestinal: Positive for abdominal pain. Negative for diarrhea, nausea and vomiting.  Genitourinary: Negative.     OBJECTIVE Patient Vitals for the past 24 hrs:  BP Temp Pulse Resp SpO2  03/31/19 1548 107/64 - 77 - -  03/31/19 1517 116/66 98.9 F (37.2 C) 86 16 100 %   Constitutional: Well-developed, well-nourished female in no acute distress.  Cardiovascular: normal rate & rhythm, no murmur Respiratory: normal rate and effort. Lung sounds clear throughout GI: Abd soft, non-tender, Pos BS x 4. No guarding or rebound  tenderness MS: Extremities nontender, no edema, normal ROM Neurologic: Alert and oriented x 4.  GU:   NEFG. Cervix closed/thick. No bleeding   LAB RESULTS Results for orders placed or performed during the hospital encounter of 03/31/19 (from the past 24 hour(s))  Urinalysis, Routine w reflex microscopic     Status: Abnormal   Collection Time: 03/31/19  3:19 PM  Result Value Ref Range   Color, Urine YELLOW YELLOW  APPearance CLOUDY (A) CLEAR   Specific Gravity, Urine 1.016 1.005 - 1.030   pH 7.0 5.0 - 8.0   Glucose, UA NEGATIVE NEGATIVE mg/dL   Hgb urine dipstick NEGATIVE NEGATIVE   Bilirubin Urine NEGATIVE NEGATIVE   Ketones, ur NEGATIVE NEGATIVE mg/dL   Protein, ur NEGATIVE NEGATIVE mg/dL   Nitrite NEGATIVE NEGATIVE   Leukocytes,Ua NEGATIVE NEGATIVE  Wet prep, genital     Status: Abnormal   Collection Time: 03/31/19  5:22 PM   Specimen: Vaginal  Result Value Ref Range   Yeast Wet Prep HPF POC NONE SEEN NONE SEEN   Trich, Wet Prep NONE SEEN NONE SEEN   Clue Cells Wet Prep HPF POC NONE SEEN NONE SEEN   WBC, Wet Prep HPF POC FEW (A) NONE SEEN   Sperm NONE SEEN     IMAGING No results found.  MAU COURSE Orders Placed This Encounter  Procedures  . Wet prep, genital  . Urinalysis, Routine w reflex microscopic  . Discharge patient   No orders of the defined types were placed in this encounter.   MDM FHT present via doppler Per review of records, had ultrasound confirming IUP & dating last month at [facility]  Pt thinks she may have had vaginal swabs & std testing at her ob appt a few weeks ago but wants to be swabbed again. No known exposure.  U/a negative for signs of infection.   Wet prep negative. GC/CT collected  ASSESSMENT 1. Abdominal cramping affecting pregnancy   2. [redacted] weeks gestation of pregnancy     PLAN Discharge home in stable condition. Discussed reasons to return to MAU GC/cT pending  Follow-up Information    [facility] Follow up.   Specialty: Obstetrics and Gynecology Why: keep scheduled appointment or call office as needed Contact information: [address] [place] [address] [phone]       [facility] Follow up.   Specialty: Obstetrics and Gynecology Why: return for worsening symptoms Contact information: [address] [id number] [facility]c [address] [place] [address] [phone]         Allergies as of 03/31/2019   No Known Allergies     Medication List    STOP taking these medications   benzonatate 100 MG capsule Commonly known as: TESSALON   cephALEXin 500 MG capsule Commonly known as: KEFLEX   erythromycin ophthalmic ointment   ibuprofen 200 MG tablet Commonly known as: ADVIL   methocarbamol 500 MG tablet Commonly known as: ROBAXIN   metroNIDAZOLE 0.75 % vaginal gel Commonly known as: METROGEL VAGINAL   metroNIDAZOLE 500 MG tablet Commonly known as: FLAGYL   naproxen 500 MG tablet Commonly known as: NAPROSYN   Polyethyl Glycol-Propyl Glycol 0.4-0.3 % Gel ophthalmic gel Commonly known as: Systane   polyvinyl alcohol 1.4 % ophthalmic solution Commonly known as: Lubricant Drops     TAKE these medications   fluticasone 50 MCG/ACT nasal spray Commonly known as: FLONASE Place 1 spray into both nostrils daily.   Mucus & Chest Congestion 100 MG/5ML Liqd Take 15 mLs by mouth 2 (two) times daily as needed (congestion).        Judeth Horn, NP 03/31/2019  5:41 PM

## 2019-03-31 NOTE — MAU Note (Signed)
Was kind of wanting a check up, hadn't been told due date.  (discussed dating in computer by period and [place]), asking about 'conception date', informed pt we are not able to tell that.  States has been having some cramping for about a wk. Denies GI or GU problems.

## 2019-04-05 LAB — GC/CHLAMYDIA PROBE AMP (~~LOC~~) NOT AT ARMC
Chlamydia: NEGATIVE
Neisseria Gonorrhea: NEGATIVE

## 2019-04-17 ENCOUNTER — Other Ambulatory Visit: Payer: Self-pay

## 2019-04-17 ENCOUNTER — Inpatient Hospital Stay (HOSPITAL_COMMUNITY)
Admission: AD | Admit: 2019-04-17 | Discharge: 2019-04-17 | Disposition: A | Discharge status: Home / Self Care | Payer: 59 | Attending: Obstetrics and Gynecology | Admitting: Obstetrics and Gynecology

## 2019-04-17 ENCOUNTER — Encounter (HOSPITAL_COMMUNITY): Payer: Self-pay

## 2019-04-17 ENCOUNTER — Other Ambulatory Visit: Payer: Self-pay | Admitting: Medical

## 2019-04-17 DIAGNOSIS — O23592 Infection of other part of genital tract in pregnancy, second trimester: Secondary | ICD-10-CM | POA: Diagnosis not present

## 2019-04-17 DIAGNOSIS — R102 Pelvic and perineal pain: Secondary | ICD-10-CM | POA: Insufficient documentation

## 2019-04-17 DIAGNOSIS — N949 Unspecified condition associated with female genital organs and menstrual cycle: Secondary | ICD-10-CM

## 2019-04-17 DIAGNOSIS — Z833 Family history of diabetes mellitus: Secondary | ICD-10-CM | POA: Insufficient documentation

## 2019-04-17 DIAGNOSIS — O9989 Other specified diseases and conditions complicating pregnancy, childbirth and the puerperium: Secondary | ICD-10-CM | POA: Diagnosis present

## 2019-04-17 DIAGNOSIS — O26892 Other specified pregnancy related conditions, second trimester: Secondary | ICD-10-CM | POA: Diagnosis not present

## 2019-04-17 DIAGNOSIS — Z3A15 15 weeks gestation of pregnancy: Secondary | ICD-10-CM

## 2019-04-17 DIAGNOSIS — B373 Candidiasis of vulva and vagina: Secondary | ICD-10-CM | POA: Insufficient documentation

## 2019-04-17 DIAGNOSIS — B3731 Acute candidiasis of vulva and vagina: Secondary | ICD-10-CM

## 2019-04-17 LAB — WET PREP, GENITAL
Clue Cells Wet Prep HPF POC: NONE SEEN
Sperm: NONE SEEN
Trich, Wet Prep: NONE SEEN

## 2019-04-17 LAB — URINALYSIS, ROUTINE W REFLEX MICROSCOPIC
Bilirubin Urine: NEGATIVE
Glucose, UA: NEGATIVE mg/dL
Hgb urine dipstick: NEGATIVE
Ketones, ur: NEGATIVE mg/dL
Leukocytes,Ua: NEGATIVE
Nitrite: NEGATIVE
Protein, ur: NEGATIVE mg/dL
Specific Gravity, Urine: 1.018 (ref 1.005–1.030)
pH: 6 (ref 5.0–8.0)

## 2019-04-17 MED ORDER — TERCONAZOLE 0.8 % VA CREA: 1.0000 | TOPICAL_CREAM | Freq: Every day | VAGINAL | 0 refills | Status: DC

## 2019-04-17 NOTE — Discharge Instructions (Signed)
Vaginal Yeast infection, Adult  Vaginal yeast infection is a condition that causes vaginal discharge as well as soreness, swelling, and redness (inflammation) of the vagina. This is a common condition. Some women get this infection frequently. What are the causes? This condition is caused by a change in the normal balance of the yeast (candida) and bacteria that live in the vagina. This change causes an overgrowth of yeast, which causes the inflammation. What increases the risk? The condition is more likely to develop in women who:  Take antibiotic medicines.  Have diabetes.  Take birth control pills.  Are pregnant.  Douche often.  Have a weak body defense system (immune system).  Have been taking steroid medicines for a long time.  Frequently wear tight clothing. What are the signs or symptoms? Symptoms of this condition include:  White, thick, creamy vaginal discharge.  Swelling, itching, redness, and irritation of the vagina. The lips of the vagina (vulva) may be affected as well.  Pain or a burning feeling while urinating.  Pain during sex. How is this diagnosed? This condition is diagnosed based on:  Your medical history.  A physical exam.  A pelvic exam. Your health care provider will examine a sample of your vaginal discharge under a microscope. Your health care provider may send this sample for testing to confirm the diagnosis. How is this treated? This condition is treated with medicine. Medicines may be over-the-counter or prescription. You may be told to use one or more of the following:  Medicine that is taken by mouth (orally).  Medicine that is applied as a cream (topically).  Medicine that is inserted directly into the vagina (suppository). Follow these instructions at home:  Lifestyle  Do not have sex until your health care provider approves. Tell your sex partner that you have a yeast infection. That person should go to his or her health care  provider and ask if they should also be treated.  Do not wear tight clothes, such as pantyhose or tight pants.  Wear breathable cotton underwear. General instructions  Take or apply over-the-counter and prescription medicines only as told by your health care provider.  Eat more yogurt. This may help to keep your yeast infection from returning.  Do not use tampons until your health care provider approves.  Try taking a sitz bath to help with discomfort. This is a warm water bath that is taken while you are sitting down. The water should only come up to your hips and should cover your buttocks. Do this 3-4 times per day or as told by your health care provider.  Do not douche.  If you have diabetes, keep your blood sugar levels under control.  Keep all follow-up visits as told by your health care provider. This is important. Contact a health care provider if:  You have a fever.  Your symptoms go away and then return.  Your symptoms do not get better with treatment.  Your symptoms get worse.  You have new symptoms.  You develop blisters in or around your vagina.  You have blood coming from your vagina and it is not your menstrual period.  You develop pain in your abdomen. Summary  Vaginal yeast infection is a condition that causes discharge as well as soreness, swelling, and redness (inflammation) of the vagina.  This condition is treated with medicine. Medicines may be over-the-counter or prescription.  Take or apply over-the-counter and prescription medicines only as told by your health care provider.  Do not douche.  Do not have sex or use tampons until your health care provider approves.  Contact a health care provider if your symptoms do not get better with treatment or your symptoms go away and then return. This information is not intended to replace advice given to you by your health care provider. Make sure you discuss any questions you have with your health care  provider. Document Released: 04/22/2005 Document Revised: 11/29/2017 Document Reviewed: 11/29/2017 Elsevier Patient Education  [place].  Round Ligament Pain  The round ligament is a cord of muscle and tissue that helps support the uterus. It can become a source of pain during pregnancy if it becomes stretched or twisted as the baby grows. The pain usually begins in the second trimester (13-28 weeks) of pregnancy, and it can come and go until the baby is delivered. It is not a serious problem, and it does not cause harm to the baby. Round ligament pain is usually a short, sharp, and pinching pain, but it can also be a dull, lingering, and aching pain. The pain is felt in the lower side of the abdomen or in the groin. It usually starts deep in the groin and moves up to the outside of the hip area. The pain may occur when you:  Suddenly change position, such as quickly going from a sitting to standing position.  Roll over in bed.  Cough or sneeze.  Do physical activity. Follow these instructions at home:   Watch your condition for any changes.  When the pain starts, relax. Then try any of these methods to help with the pain: ? Sitting down. ? Flexing your knees up to your abdomen. ? Lying on your side with one pillow under your abdomen and another pillow between your legs. ? Sitting in a warm bath for 15-20 minutes or until the pain goes away.  Take over-the-counter and prescription medicines only as told by your health care provider.  Move slowly when you sit down or stand up.  Avoid long walks if they cause pain.  Stop or reduce your physical activities if they cause pain.  Keep all follow-up visits as told by your health care provider. This is important. Contact a health care provider if:  Your pain does not go away with treatment.  You feel pain in your back that you did not have before.  Your medicine is not helping. Get help right away if:  You have a fever  or chills.  You develop uterine contractions.  You have vaginal bleeding.  You have nausea or vomiting.  You have diarrhea.  You have pain when you urinate. Summary  Round ligament pain is felt in the lower abdomen or groin. It is usually a short, sharp, and pinching pain. It can also be a dull, lingering, and aching pain.  This pain usually begins in the second trimester (13-28 weeks). It occurs because the uterus is stretching with the growing baby, and it is not harmful to the baby.  You may notice the pain when you suddenly change position, when you cough or sneeze, or during physical activity.  Relaxing, flexing your knees to your abdomen, lying on one side, or taking a warm bath may help to get rid of the pain.  Get help from your health care provider if the pain does not go away or if you have vaginal bleeding, nausea, vomiting, diarrhea, or painful urination. This information is not intended to replace advice given to you by your health care  provider. Make sure you discuss any questions you have with your health care provider. Document Released: 04/21/2008 Document Revised: 12/29/2017 Document Reviewed: 12/29/2017 Elsevier Patient Education  2020 [employer]

## 2019-04-17 NOTE — MAU Note (Signed)
Pt presents to MAU with c/o white vaginal discharge, itchiness and lower abdominal cramping. She has concerns about yeast infection.

## 2019-04-17 NOTE — MAU Provider Note (Signed)
History     CSN: [id number]  Arrival date and time: 04/17/19 1555   First Provider Initiated Contact with Patient 04/17/19 1626      Chief Complaint  Patient presents with  . Abdominal Pain  . Vaginal Discharge   HPI  [name] is a 23 y.o. G1P0 at [redacted]w[redacted]d who presents to MAU today with complaint of lower abdominal cramping and discharge x 2 days. The patient states the discharge is thin, white and causing itching. She rates her cramping at 6/10. She has not tried anything for pain. She denies vaginal bleeding, UTI symptoms, N/V/D or fever.   OB History    Gravida  1   Para      Term      Preterm      AB      Living        SAB      TAB      Ectopic      Multiple      Live Births              Past Medical History:  Diagnosis Date  . Anxiety   . Chlamydia 2016  . UTI (urinary tract infection)   . Vaginal yeast infection     Past Surgical History:  Procedure Laterality Date  . NO PAST SURGERIES      Family History  Problem Relation Age of Onset  . Hypertension Mother   . Irregular heart beat Mother   . Kidney Stones Father   . Hypertension Maternal Grandmother   . Kidney disease Maternal Grandmother   . Diabetes Maternal Grandmother   . Heart failure Maternal Grandmother   . Colon cancer Neg Hx   . Colon polyps Neg Hx   . Esophageal cancer Neg Hx   . Rectal cancer Neg Hx   . Stomach cancer Neg Hx   . Neuromuscular disorder Neg Hx     Social History   Tobacco Use  . Smoking status: Never Smoker  . Smokeless tobacco: Never Used  Substance Use Topics  . Alcohol use: Yes  . Drug use: No    Allergies: No Known Allergies  Medications Prior to Admission  Medication Sig Dispense Refill Last Dose  . fluticasone (FLONASE) 50 MCG/ACT nasal spray Place 1 spray into both nostrils daily. (Patient not taking: Reported on 12/07/2018) 16 g 0   . guaiFENesin (MUCUS & CHEST CONGESTION) 100 MG/5ML LIQD Take 15 mLs by mouth 2 (two) times  daily as needed (congestion).       Review of Systems  Constitutional: Negative for fever.  Gastrointestinal: Positive for abdominal pain. Negative for constipation, diarrhea, nausea and vomiting.  Genitourinary: Positive for vaginal discharge. Negative for dysuria, frequency, urgency and vaginal bleeding.   Physical Exam   Blood pressure (!) 114/58, pulse 94, temperature 97.9 F (36.6 C), last menstrual period 12/28/2018, SpO2 100 %.  Physical Exam  Nursing note and vitals reviewed. Constitutional: She is oriented to person, place, and time. She appears well-developed and well-nourished. No distress.  HENT:  Head: Normocephalic and atraumatic.  Cardiovascular: Normal rate.  Respiratory: Effort normal.  GI: Soft. She exhibits no distension and no mass. There is no abdominal tenderness. There is no rebound and no guarding.  Genitourinary: Uterus is enlarged. Uterus is not tender. Cervix exhibits no motion tenderness, no discharge and no friability.    Vaginal discharge (small, white) present.     No vaginal bleeding.  No bleeding in the vagina.  Neurological: She is alert and oriented to person, place, and time.  Skin: Skin is warm and dry. No erythema.  Psychiatric: She has a normal mood and affect.  Dilation: Closed Effacement (%): Thick Cervical Position: Posterior Exam by:: Vonzella NippleJulie Wirt Hemmerich, PA-C   Results for orders placed or performed during the hospital encounter of 04/17/19 (from the past 24 hour(s))  Urinalysis, Routine w reflex microscopic     Status: Abnormal   Collection Time: 04/17/19  4:04 PM  Result Value Ref Range   Color, Urine YELLOW YELLOW   APPearance HAZY (A) CLEAR   Specific Gravity, Urine 1.018 1.005 - 1.030   pH 6.0 5.0 - 8.0   Glucose, UA NEGATIVE NEGATIVE mg/dL   Hgb urine dipstick NEGATIVE NEGATIVE   Bilirubin Urine NEGATIVE NEGATIVE   Ketones, ur NEGATIVE NEGATIVE mg/dL   Protein, ur NEGATIVE NEGATIVE mg/dL   Nitrite NEGATIVE NEGATIVE    Leukocytes,Ua NEGATIVE NEGATIVE  Wet prep, genital     Status: Abnormal   Collection Time: 04/17/19  4:35 PM   Specimen: Vaginal  Result Value Ref Range   Yeast Wet Prep HPF POC PRESENT (A) NONE SEEN   Trich, Wet Prep NONE SEEN NONE SEEN   Clue Cells Wet Prep HPF POC NONE SEEN NONE SEEN   WBC, Wet Prep HPF POC FEW (A) NONE SEEN   Sperm NONE SEEN     MAU Course  Procedures None  MDM FHR - 145 bpm  Assessment and Plan  A: SIUP at 2142w5d Round ligament pain  Yeast vaginitis   P: Discharge home Rx for Terazol 3 sent to patient's pharmacy  Second trimester precautions discussed Advised patient that abdominal binder may help Discussed Tylenol PRN for pain  Patient advised to follow-up with [facility] as scheduled for routine prenatal care  Patient may return to MAU as needed or if her condition were to change or worsen  Vonzella NippleJulie Rifka Ramey, PA-C 04/17/2019, 5:10 PM

## 2019-04-19 LAB — CERVICOVAGINAL ANCILLARY ONLY
Chlamydia: NEGATIVE
Neisseria Gonorrhea: NEGATIVE

## 2019-05-15 ENCOUNTER — Other Ambulatory Visit: Payer: Self-pay

## 2019-05-15 ENCOUNTER — Inpatient Hospital Stay (HOSPITAL_COMMUNITY)
Admission: AD | Admit: 2019-05-15 | Discharge: 2019-05-15 | Disposition: A | Discharge status: Home / Self Care | Payer: 59 | Attending: Obstetrics & Gynecology | Admitting: Obstetrics & Gynecology

## 2019-05-15 ENCOUNTER — Encounter (HOSPITAL_COMMUNITY): Payer: Self-pay | Admitting: Obstetrics and Gynecology

## 2019-05-15 DIAGNOSIS — Z3A19 19 weeks gestation of pregnancy: Secondary | ICD-10-CM | POA: Diagnosis not present

## 2019-05-15 DIAGNOSIS — H1032 Unspecified acute conjunctivitis, left eye: Secondary | ICD-10-CM | POA: Diagnosis not present

## 2019-05-15 DIAGNOSIS — O26892 Other specified pregnancy related conditions, second trimester: Secondary | ICD-10-CM | POA: Insufficient documentation

## 2019-05-15 MED ORDER — ERYTHROMYCIN 5 MG/GM OP OINT: TOPICAL_OINTMENT | OPHTHALMIC | 0 refills | Status: AC

## 2019-05-15 NOTE — MAU Provider Note (Signed)
History     CSN: [id number]  Arrival date and time: 05/15/19 1308   First Provider Initiated Contact with Patient 05/15/19 1336      Chief Complaint  Patient presents with  . Conjunctivitis   HPI  Ms.  Laura Mendoza is a 23 y.o. year old G1P0 female at [redacted]w[redacted]d weeks gestation who presents to MAU reporting her boyfriend was dx'd with pink eye last week. She reports that her LT eye started itching on Friday 05/12/2019, swelling on Saturday 05/13/2019. She states her eye got worse on Sunday 05/14/2019. She reports that when she woke up this morning her eye was red, watery, pus, and crusting. She denies fever. She has no pregnancy complaints. She is a patient of [facility], but did not call them. She reports that she "didn't know they had someone to answer their phones on the weekends when they are closed."  Past Medical History:  Diagnosis Date  . Anxiety   . Chlamydia 2016  . UTI (urinary tract infection)   . Vaginal yeast infection     Past Surgical History:  Procedure Laterality Date  . NO PAST SURGERIES      Family History  Problem Relation Age of Onset  . Hypertension Mother   . Irregular heart beat Mother   . Kidney Stones Father   . Hypertension Maternal Grandmother   . Kidney disease Maternal Grandmother   . Diabetes Maternal Grandmother   . Heart failure Maternal Grandmother   . Colon cancer Neg Hx   . Colon polyps Neg Hx   . Esophageal cancer Neg Hx   . Rectal cancer Neg Hx   . Stomach cancer Neg Hx   . Neuromuscular disorder Neg Hx     Social History   Tobacco Use  . Smoking status: Never Smoker  . Smokeless tobacco: Never Used  Substance Use Topics  . Alcohol use: Yes  . Drug use: No    Allergies: No Known Allergies  Medications Prior to Admission  Medication Sig Dispense Refill Last Dose  . guaiFENesin (MUCUS & CHEST CONGESTION) 100 MG/5ML LIQD Take 15 mLs by mouth 2 (two) times daily as needed (congestion).     Marland Kitchen terconazole (TERAZOL 3) 0.8 %  vaginal cream Place 1 applicator vaginally at bedtime. 20 g 0     Review of Systems  Constitutional: Negative.   HENT: Negative.   Eyes: Positive for pain, discharge, redness and itching.  Respiratory: Negative.   Cardiovascular: Negative.   Gastrointestinal: Negative.   Endocrine: Negative.   Genitourinary: Negative.   Musculoskeletal: Negative.   Skin: Negative.   Allergic/Immunologic: Negative.   Neurological: Negative.   Hematological: Negative.   Psychiatric/Behavioral: Negative.    Physical Exam   Blood pressure 122/72, pulse (!) 102, temperature 99 F (37.2 C), temperature source Oral, resp. rate 16, weight 50.5 kg, last menstrual period 12/28/2018, SpO2 100 %.  Physical Exam  Nursing note and vitals reviewed. Constitutional: She is oriented to person, place, and time. She appears well-developed and well-nourished.  HENT:  Head: Normocephalic and atraumatic.  Eyes: Pupils are equal, round, and reactive to light. EOM are normal. Right eye exhibits discharge. Left eye exhibits chemosis, discharge and exudate.    Neck: Normal range of motion.  Cardiovascular: Normal rate.  Respiratory: Effort normal.  GI: Soft.  Genitourinary:    Genitourinary Comments: deferred   Musculoskeletal: Normal range of motion.  Neurological: She is alert and oriented to person, place, and time.  Skin: Skin is warm and dry.  Psychiatric: She has a normal mood and affect. Her behavior is normal. Judgment and thought content normal.   FHTs by doppler: 152 bpm   MAU Course  Procedures  MDM Discussed MAU not an appropriate facility for evaluation of non-pregnancy related complaints, but to avoid her having to go from place to place a Rx will sent to her pharmacy. She is advised to call her OB office or her PCP.   Assessment and Plan  Acute conjunctivitis of left eye, unspecified acute conjunctivitis type  - Rx for Erythromycin ophthalmic ointment x 7 days - Information provided on  bacterial conjuncvitis and how to use eye drops and ointments  - Advised if condition worsens, to seek medical care at PCP, Urgent Care or ED - Ok to take Tylenol 1000 mg every 6 hrs prn pain  - Discharge patient - Keep scheduled appt with [facility] - Patient verbalized an understanding of the plan of care and agrees.    Laury Deep, MSN, CNM 05/15/2019, 1:47 PM

## 2019-05-15 NOTE — MAU Note (Signed)
Boyfriend had pink eye last work. Left eye started itching on Friday, swelling started on Saturday.  Got a whole lot worse on Sunday.  Sclera in left eye is pinkish red; eyelids are very swollen. States is really watery, some crusting and pus when woke up this morning.  Denies fever

## 2019-05-15 NOTE — Discharge Instructions (Signed)
How to Use Eye Drops and Eye Ointments Your health care provider may prescribe or recommend eye drops or ointments for a variety of reasons, such as to help relieve symptoms like redness, dryness, and itchiness. You may also need to use eye drops or ointments to treat an eye infection or before or after surgery on your eye. You should use eye drops and ointments only as told by your health care provider. You may need to have a caregiver or family member help you place eye drops or ointment in your eye. How to use eye drops Follow these steps when putting eye drops in your eye: 1. Wash your hands with soap and water. 2. Follow any instructions for mixing or shaking eye drops prior to using them. 3. Stand in front of a mirror so that you can see your eye well. 4. Place one finger under your eye and use it to gently pull your lower lid downward. This forms a small pocket to place the drop. Keep that finger in place. 5. Using your other hand, hold the dropper between your thumb and index finger. 6. Position the dropper just above the edge of your lower eyelid. Do not touch the dropper to your lid or your eyeball. 7. Steady your hand. One way to do this is to lean your index finger against your brow. 8. Look up slightly. 9. Slowly and gently squeeze one drop of medicine into your eye near the lower lid. 10. Gently close your eye. 11. Place a finger between your lower eyelid and your nose. Press gently for 2 minutes. This increases the amount of time that the medicine is exposed to the eye and can help prevent certain side effects. 12. Do not rub your eye. How to apply eye ointments Follow these steps when applying eye ointments: 1. Wash your hands with soap and water. 2. Stand in front of a mirror so that you can see your eye well. 3. Place one finger under your eye and use it to gently pull your lower lid downward. Keep that finger in place. 4. Using your other hand, hold the tip of the tube  between your thumb and index finger. Brace your other fingers against your cheek or nose. 5. Hold the tube just above the edge of your lower eyelid. Do not touch the tube to your lid or your eyeball. 6. Line the inner part of your lower lid with ointment. 7. Let go of your lower lid. 8. Gently pull up on your upper lid and look down. This will spread the ointment over the surface of your eye. 9. Let go of the upper lid. 10. Gently close your eyes. If you can, leave them closed for 1-2 minutes. 11. Do not rub your eyes. If you applied the ointment correctly, your vision will be blurry for a few minutes. This is normal. General tips  Make sure you use the eye drops or ointment only as told by your health care provider.  Ask for help from a caregiver or family member if you are unable to apply the eye drops or ointment.  If you have been told to use both eye drops and an eye ointment, apply the eye drops first, then wait 3-4 minutes before you apply the ointment.  Try not to touch the tip of the dropper or tube to your eye. A dropper or tube that has touched the eye can get germs on it (get contaminated). Summary  Your health care provider may   prescribe or recommend eye drops or ointments to relieve symptoms like redness, dryness, and itchiness.  Be sure to wash your hands with soap and water before applying eye drops or ointment.  You may need to have a caregiver or family member help you place eye drops or ointment in your eye.  Make sure you use the eye drops or ointment only as told by your health care provider. This information is not intended to replace advice given to you by your health care provider. Make sure you discuss any questions you have with your health care provider. Document Released: 10/19/2000 Document Revised: 07/15/2017 Document Reviewed: 07/15/2017 Elsevier Patient Education  2020 [employer]  

## 2021-03-10 IMAGING — US OBSTETRIC <14 WK ULTRASOUND
1 series · 13 of 28 positions shown · non-contrast
Comparison: Abdomen and pelvis CT dated 07/25/2017.

CLINICAL DATA: Pelvic cramping. Five weeks and 2 days pregnant by
last menstrual period. Positive urine pregnancy test.

EXAM:
OBSTETRIC <14 WK US AND TRANSVAGINAL OB US
TECHNIQUE: Both transabdominal and transvaginal ultrasound examinations were
performed for complete evaluation of the gestation as well as the
maternal uterus, adnexal regions, and pelvic cul-de-sac.
Transvaginal technique was performed to assess early pregnancy.

[Series 1: obstetric <14 wk ultrasound · 13 of 99 slices shown]
[im 4/99]
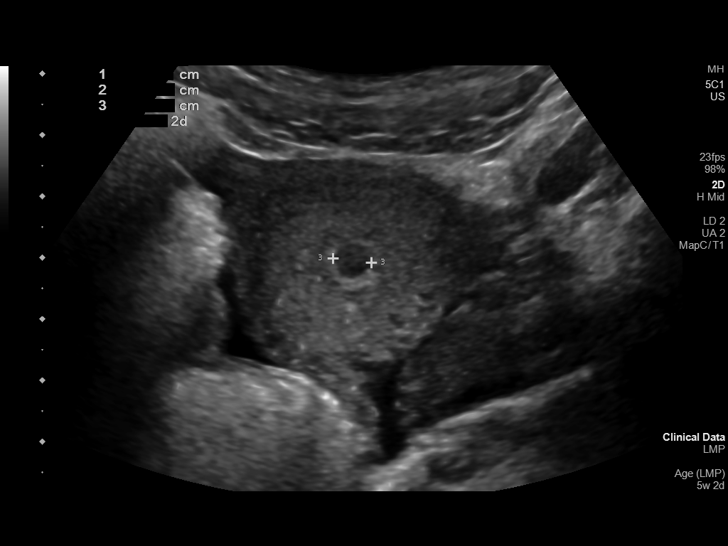
[im 11/99]
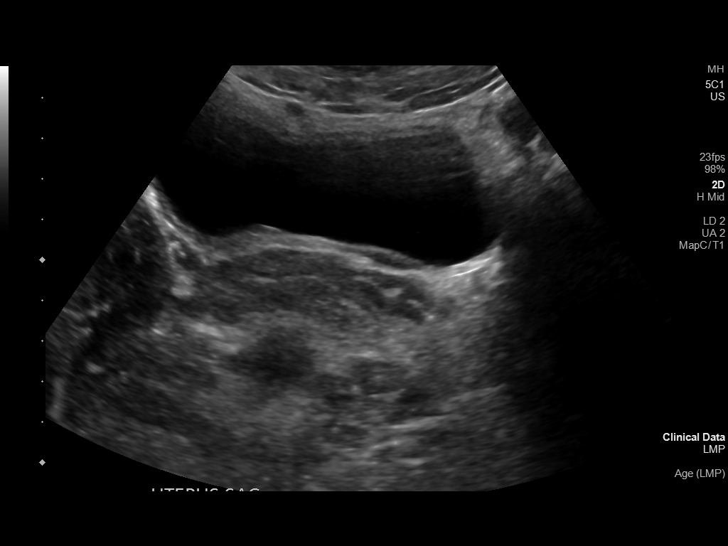
[im 19/99]
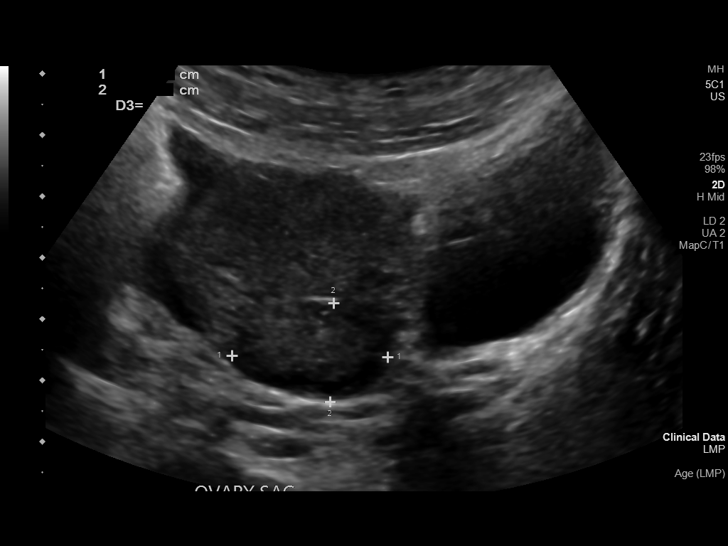
[im 26/99]
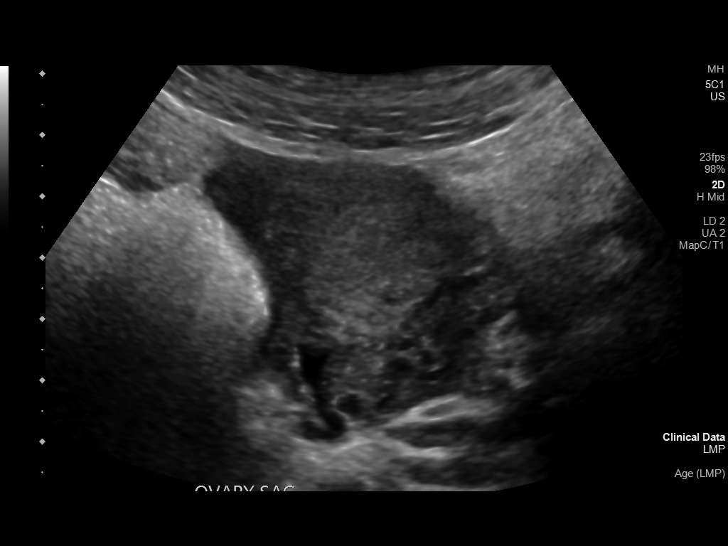
[im 33/99]
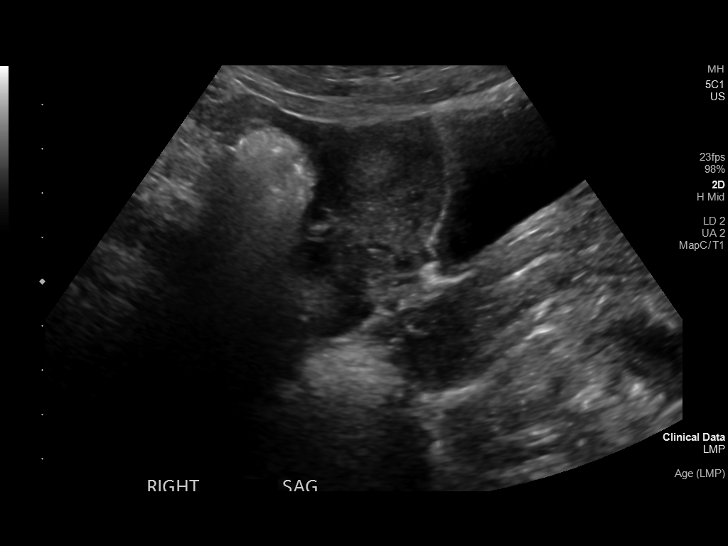
[im 40/99]
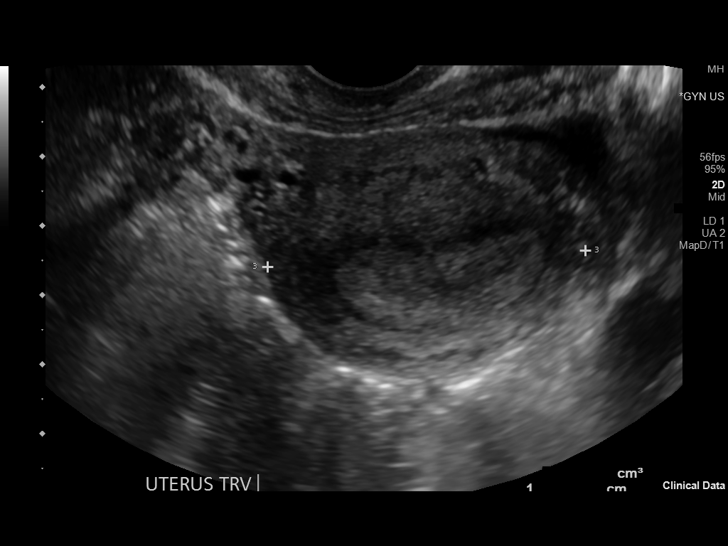
[im 51/99]
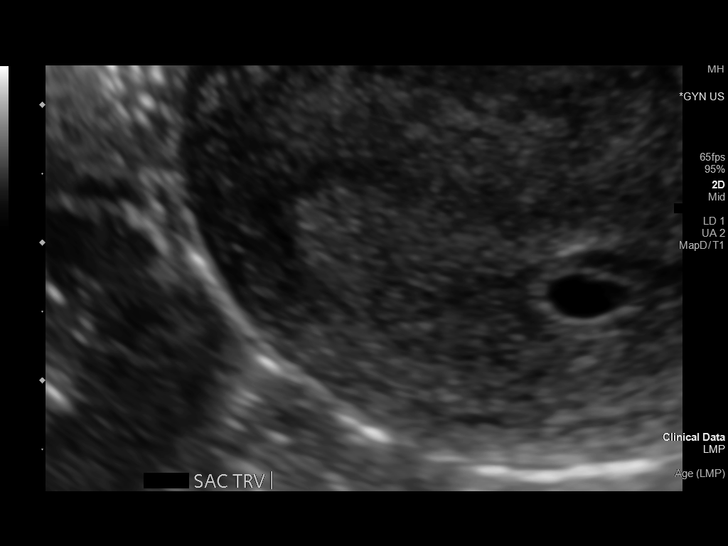
[im 59/99]
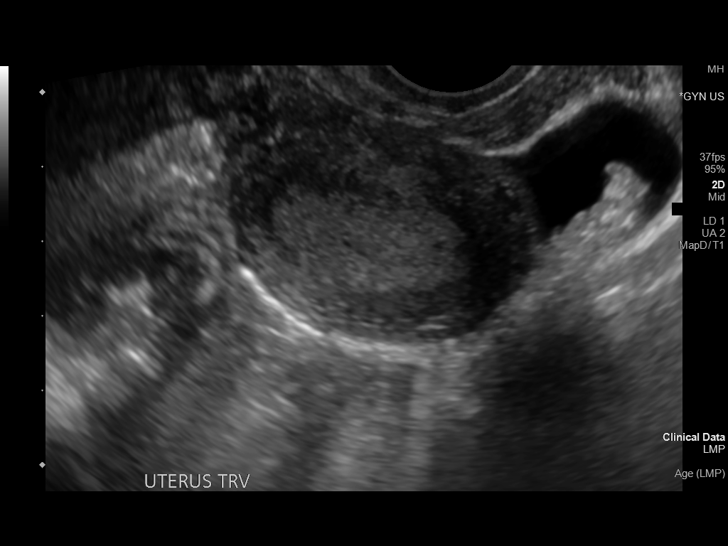
[im 66/99]
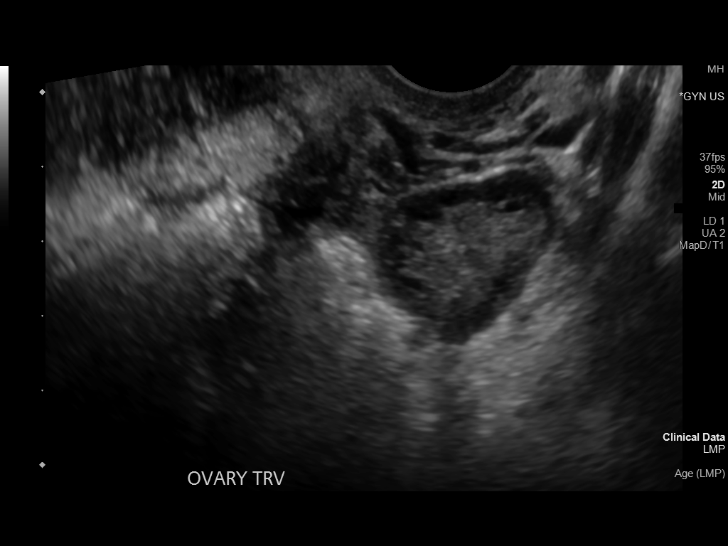
[im 73/99]
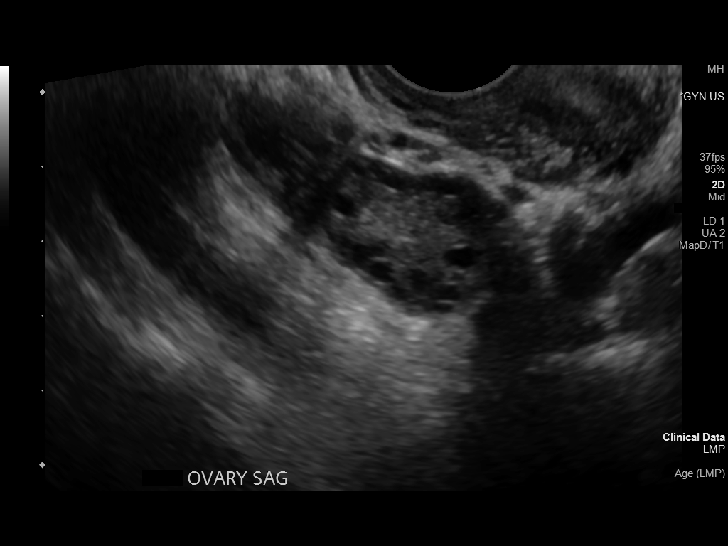
[im 80/99]
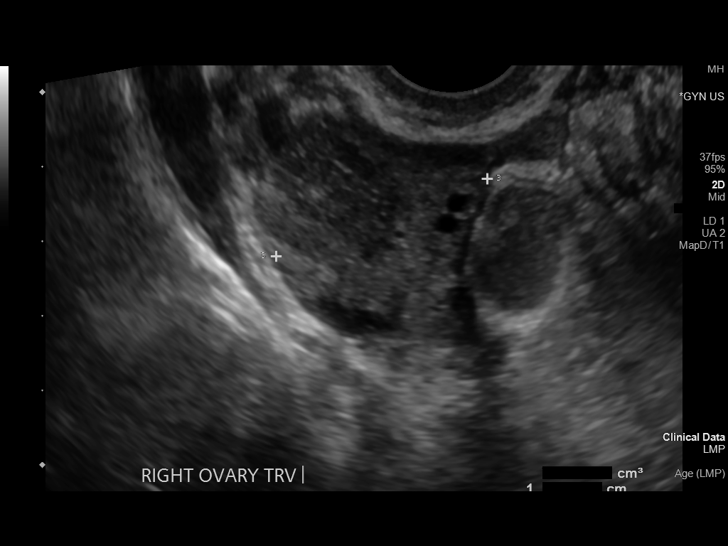
[im 88/99]
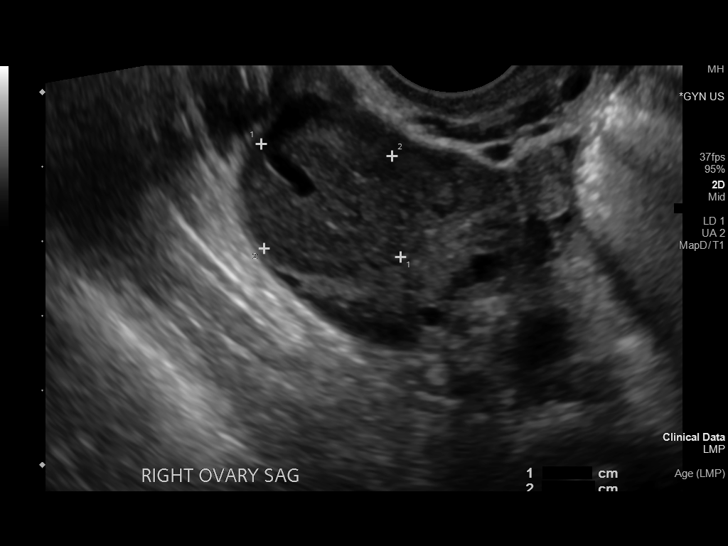
[im 95/99]
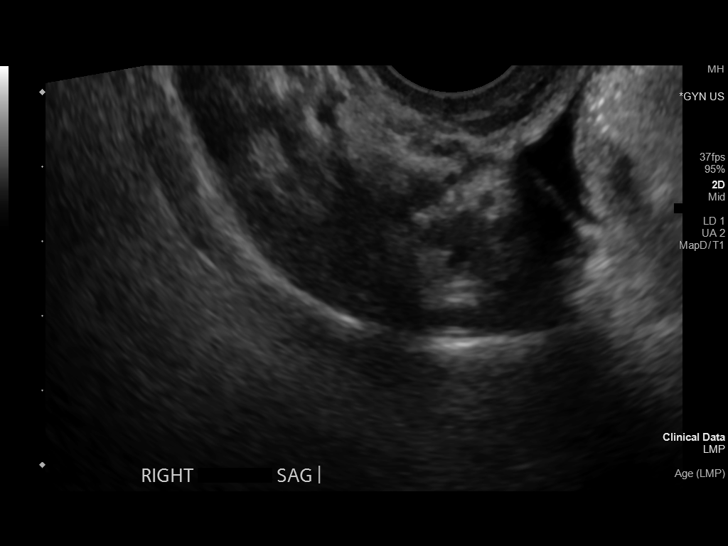

[13 of 28 positions shown; findings below may reference images not displayed]

FINDINGS: Intrauterine gestational sac: Visualized

Yolk sac:  Not visualized

Embryo:  Not visualized

MSD: 5.8 mm mm   5 w   2 d

Subchorionic hemorrhage:  None visualized.

Maternal uterus/adnexae: Normal appearing ovaries with a right
ovarian corpus luteum noted. Small amount of free peritoneal fluid.
IMPRESSION: 1. Intrauterine gestational sac with an estimated gestational age of
5 weeks and 2 days with no yolk sac, fetal pole, or cardiac activity
yet visualized. Recommend follow-up quantitative B-HCG levels and
follow-up US in 14 days to assess viability. This recommendation
follows SRU consensus guidelines: Diagnostic Criteria for Nonviable
Pregnancy Early in the First Trimester. N Engl J Med 7888;
2. Small amount of free peritoneal fluid.

## 2021-04-09 IMAGING — US TRANSVAGINAL OB ULTRASOUND
1 series · 15 of 28 positions shown · non-contrast
Comparison: 02/07/2019

CLINICAL DATA: First trimester pregnancy with inconclusive fetal
viability.

EXAM:
TRANSVAGINAL OB ULTRASOUND
TECHNIQUE: Transvaginal ultrasound was performed for complete evaluation of the
gestation as well as the maternal uterus, adnexal regions, and
pelvic cul-de-sac.

[Series 1: transvaginal ob ultrasound · 15 of 44 slices shown]
[im 1/44]
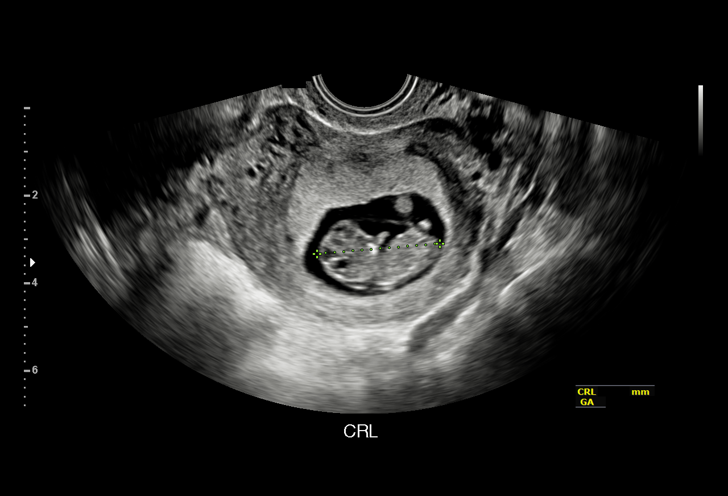
[im 4/44]
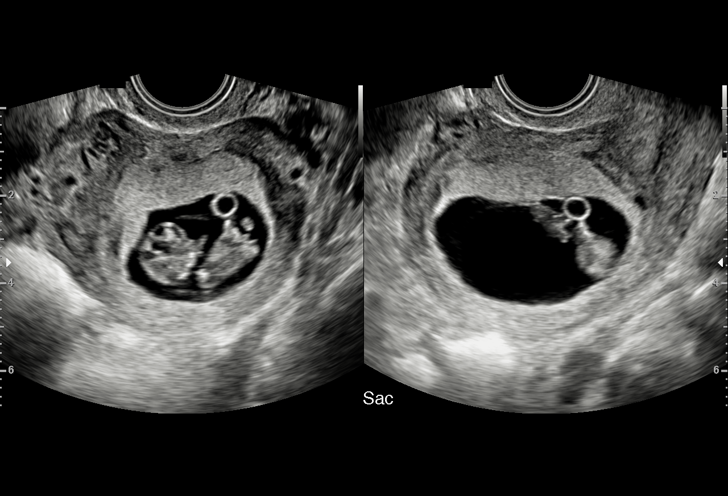
[im 7/44]
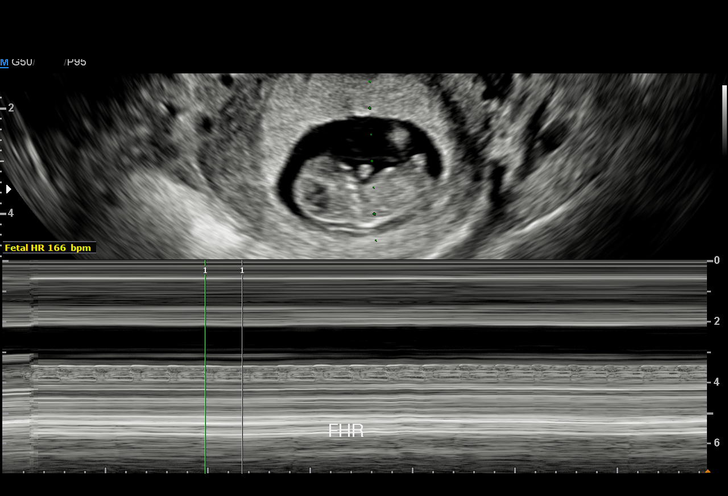
[im 10/44]
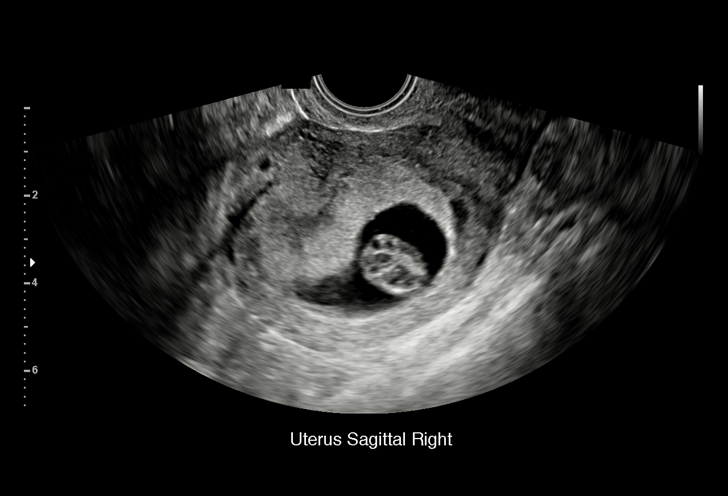
[im 13/44]
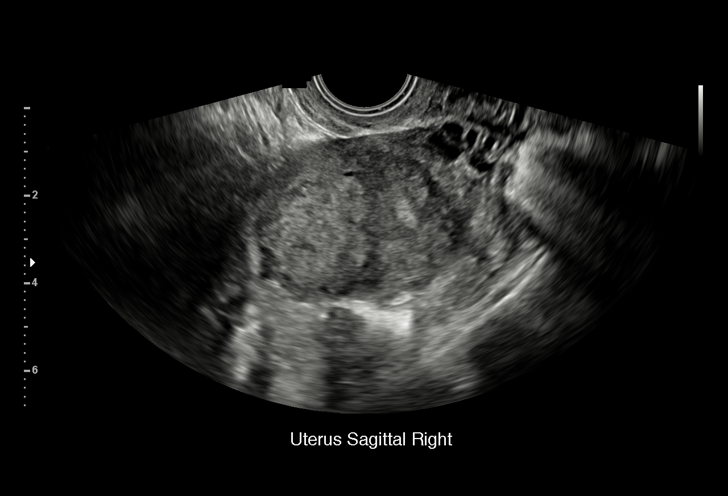
[im 16/44]
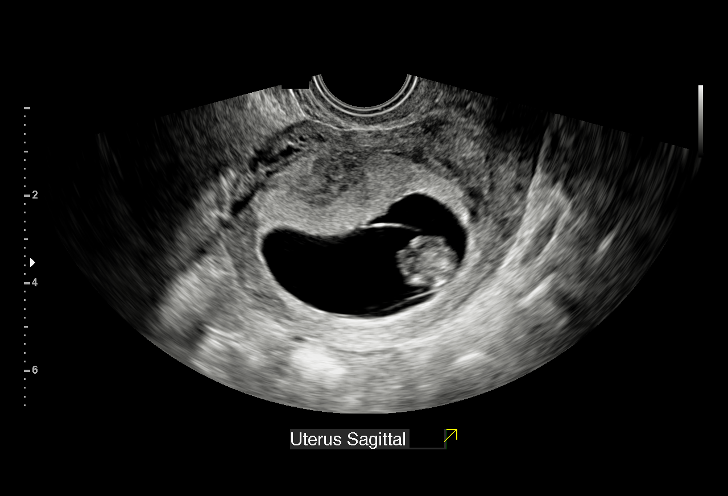
[im 20/44]
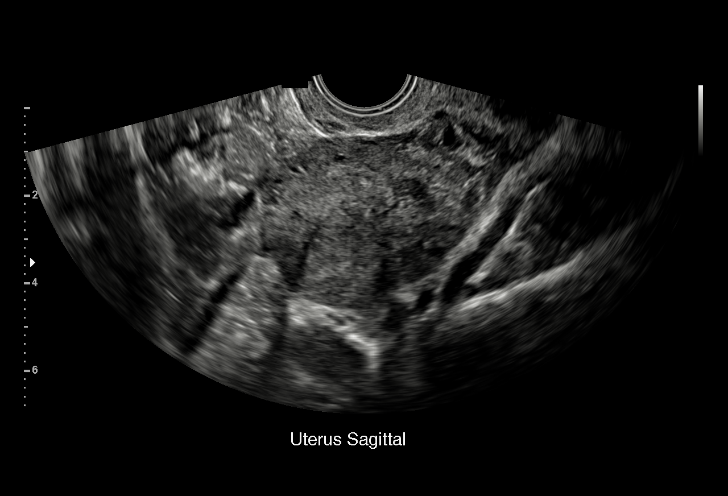
[im 23/44]
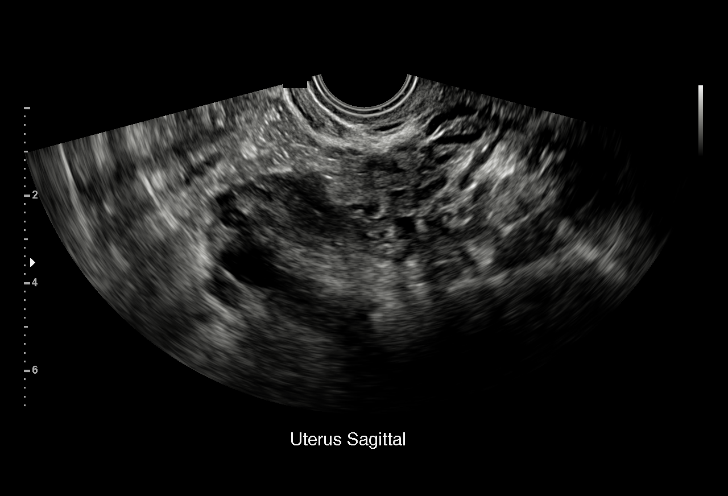
[im 24/44]
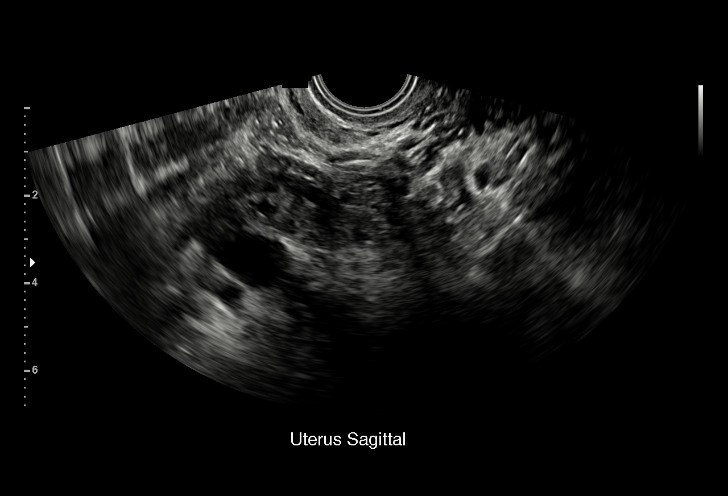
[im 28/44]
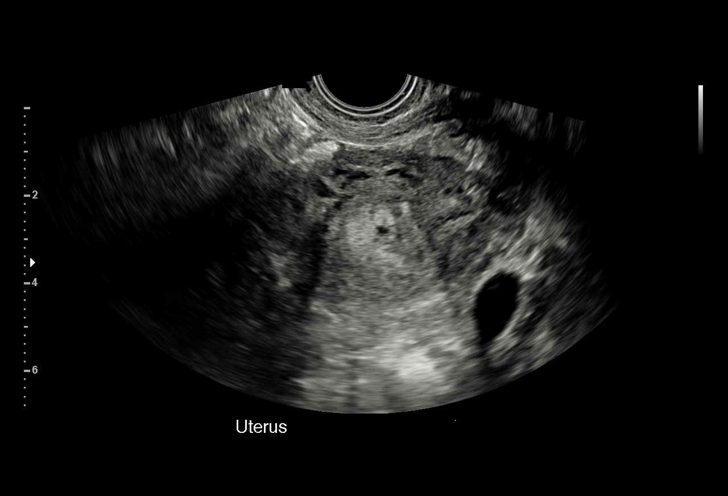
[im 31/44]
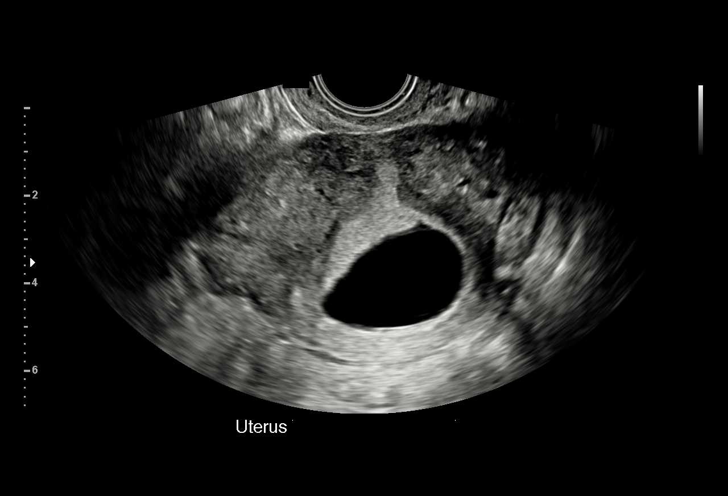
[im 34/44]
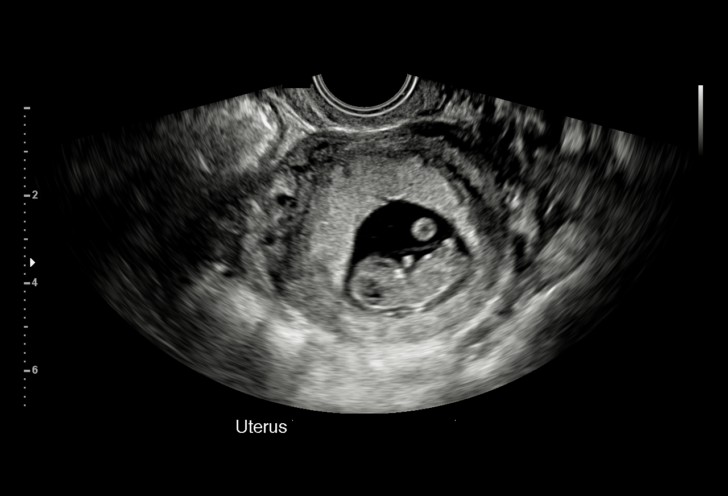
[im 37/44]
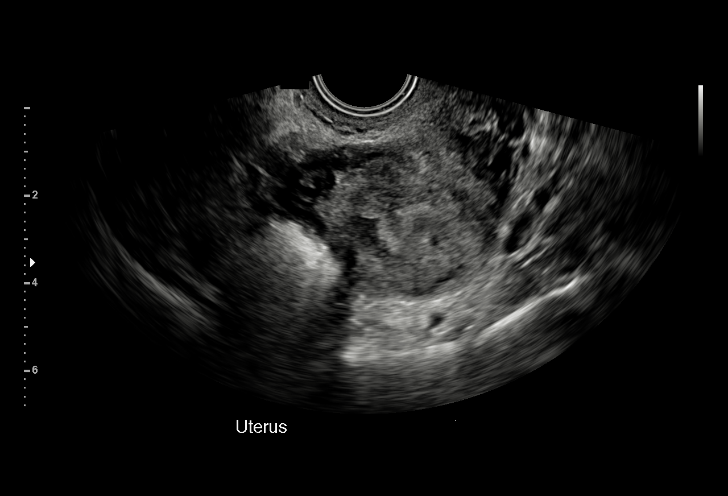
[im 40/44]
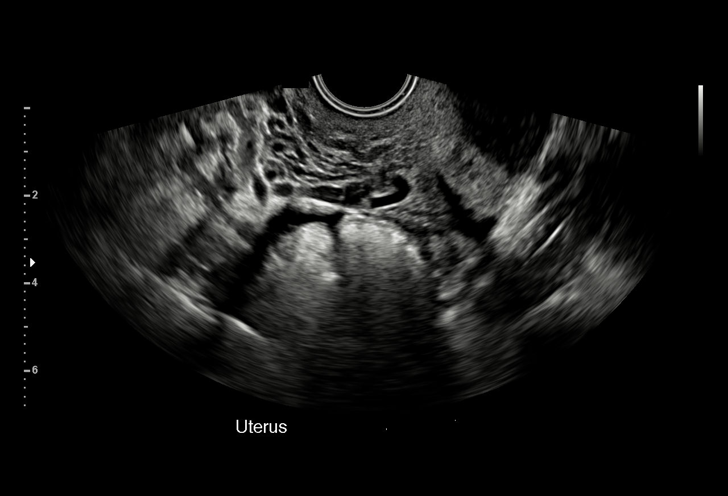
[im 44/44]
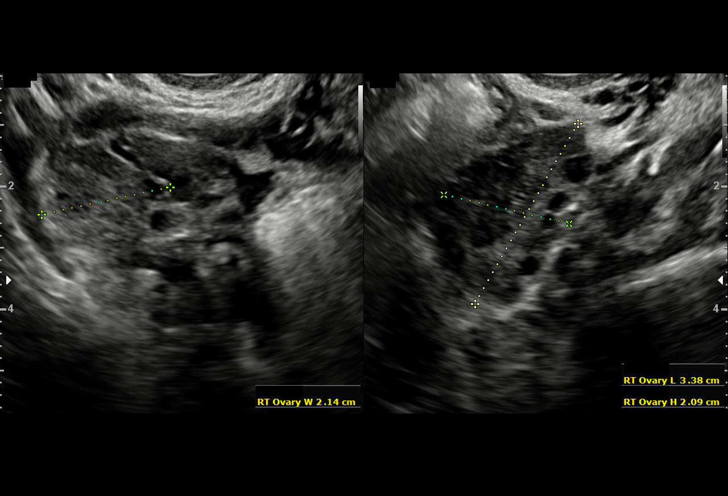

[15 of 28 positions shown; findings below may reference images not displayed]

FINDINGS: Intrauterine gestational sac: Single

Yolk sac:  Visualized.

Embryo:  Visualized.

Cardiac Activity: Visualized.

Heart Rate: 166 bpm

CRL:   28 mm   9 w 4 d                  US EDC: 10/08/2018

Subchorionic hemorrhage:  None visualized.

Maternal uterus/adnexae: Normal appearance of both ovaries. No mass
or abnormal free fluid identified.
IMPRESSION: Single living IUP measuring 9 weeks 4 days, with US EDC of
10/08/2018.

No significant maternal uterine or adnexal abnormality identified.

## 2021-09-01 ENCOUNTER — Emergency Department (HOSPITAL_COMMUNITY)
Admission: EM | Admit: 2021-09-01 | Discharge: 2021-09-02 | Disposition: A | Discharge status: Home / Self Care | Payer: Medicaid Other | Attending: Emergency Medicine | Admitting: Emergency Medicine

## 2021-09-01 DIAGNOSIS — Z5321 Procedure and treatment not carried out due to patient leaving prior to being seen by health care provider: Secondary | ICD-10-CM | POA: Insufficient documentation

## 2021-09-01 DIAGNOSIS — M791 Myalgia, unspecified site: Secondary | ICD-10-CM | POA: Diagnosis present

## 2021-09-01 DIAGNOSIS — U071 COVID-19: Secondary | ICD-10-CM | POA: Diagnosis not present

## 2021-09-01 LAB — RESP PANEL BY RT-PCR (FLU A&B, COVID) ARPGX2
Influenza A by PCR: NEGATIVE
Influenza B by PCR: NEGATIVE
SARS Coronavirus 2 by RT PCR: POSITIVE — AB

## 2021-09-01 MED ORDER — ACETAMINOPHEN 325 MG PO TABS
650.0000 mg | ORAL_TABLET | Freq: Four times a day (QID) | ORAL | Status: DC | PRN
  Administered 2021-09-01: 650 mg via ORAL

## 2021-09-01 NOTE — ED Triage Notes (Signed)
Pt arrives POV for eval of body aches and malaise x 24 hours.

## 2021-09-02 NOTE — ED Notes (Signed)
Patient has been calledx4 for updated vitalsigns no answer

## 2024-07-02 ENCOUNTER — Emergency Department (HOSPITAL_COMMUNITY)
Admission: EM | Admit: 2024-07-02 | Discharge: 2024-07-02 | Discharge status: Against Medical Advice | Attending: Emergency Medicine | Admitting: Emergency Medicine

## 2024-07-02 ENCOUNTER — Encounter (HOSPITAL_COMMUNITY): Payer: Self-pay

## 2024-07-02 ENCOUNTER — Other Ambulatory Visit: Payer: Self-pay

## 2024-07-02 ENCOUNTER — Ambulatory Visit (HOSPITAL_COMMUNITY)

## 2024-07-02 NOTE — ED Notes (Signed)
 Pt stated she was getting her charger from the car 45 more minutes. Pt stated she was coming back.

## 2024-07-02 NOTE — ED Notes (Signed)
 Pt has still not been back.

## 2024-07-02 NOTE — ED Triage Notes (Signed)
 Pt came in for a cough x3 weeks. Pt stated she had a productive cough only if she coughs hard. Pt wants an xray at a covid test.

## 2024-07-03 LAB — RESP PANEL BY RT-PCR (RSV, FLU A&B, COVID)  RVPGX2
Influenza A by PCR: NEGATIVE
Influenza B by PCR: NEGATIVE
Resp Syncytial Virus by PCR: NEGATIVE
SARS Coronavirus 2 by RT PCR: NEGATIVE
